# Patient Record
Sex: Female | Born: 1990 | Race: Black or African American | Hispanic: No | Smoking: Never smoker
Health system: Southern US, Community
[De-identification: ages and names within clinical notes are randomized; demographics above are authoritative.]

## PROBLEM LIST (undated history)

## (undated) DIAGNOSIS — F419 Anxiety disorder, unspecified: Secondary | ICD-10-CM

## (undated) DIAGNOSIS — D649 Anemia, unspecified: Secondary | ICD-10-CM

## (undated) DIAGNOSIS — Z789 Other specified health status: Secondary | ICD-10-CM

## (undated) DIAGNOSIS — N63 Unspecified lump in unspecified breast: Secondary | ICD-10-CM

## (undated) HISTORY — DX: Anemia, unspecified: D64.9

## (undated) HISTORY — PX: HERNIA REPAIR: SHX51

---
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
## 2011-03-16 LAB — STREP B DNA PROBE: GBS: NEGATIVE

## 2011-04-09 ENCOUNTER — Encounter (HOSPITAL_COMMUNITY): Payer: Self-pay | Admitting: *Deleted

## 2011-04-09 ENCOUNTER — Inpatient Hospital Stay (HOSPITAL_COMMUNITY)
Admission: AD | Admit: 2011-04-09 | Discharge: 2011-04-11 | DRG: 775 | Disposition: A | Discharge status: Home / Self Care | Payer: Medicaid Other | Source: Ambulatory Visit | Attending: Obstetrics & Gynecology | Admitting: Obstetrics & Gynecology

## 2011-04-09 DIAGNOSIS — O411090 Infection of amniotic sac and membranes, unspecified, unspecified trimester, not applicable or unspecified: Principal | ICD-10-CM | POA: Diagnosis present

## 2011-04-09 HISTORY — DX: Other specified health status: Z78.9

## 2011-04-09 LAB — CBC
HCT: 33.8 % — ABNORMAL LOW (ref 36.0–46.0)
Hemoglobin: 11 g/dL — ABNORMAL LOW (ref 12.0–15.0)
MCH: 27.6 pg (ref 26.0–34.0)
MCV: 84.9 fL (ref 78.0–100.0)
Platelets: 371 10*3/uL (ref 150–400)
RBC: 3.98 MIL/uL (ref 3.87–5.11)
WBC: 10.6 10*3/uL — ABNORMAL HIGH (ref 4.0–10.5)

## 2011-04-09 LAB — ABO/RH: ABO/RH(D): O POS

## 2011-04-09 MED ORDER — ONDANSETRON HCL 4 MG/2ML IJ SOLN: 4.0000 mg | Freq: Four times a day (QID) | INTRAMUSCULAR | Status: DC | PRN

## 2011-04-09 MED ORDER — IBUPROFEN 600 MG PO TABS
600.0000 mg | ORAL_TABLET | Freq: Four times a day (QID) | ORAL | Status: DC
  Administered 2011-04-10 – 2011-04-11 (×6): 600 mg via ORAL
  Filled 2011-04-09 (×5): qty 1

## 2011-04-09 MED ORDER — ACETAMINOPHEN 325 MG PO TABS: 650.0000 mg | ORAL_TABLET | ORAL | Status: DC | PRN

## 2011-04-09 MED ORDER — DIPHENHYDRAMINE HCL 25 MG PO CAPS: 25.0000 mg | ORAL_CAPSULE | Freq: Four times a day (QID) | ORAL | Status: DC | PRN

## 2011-04-09 MED ORDER — OXYTOCIN BOLUS FROM INFUSION
500.0000 mL | Freq: Once | INTRAVENOUS | Status: DC
  Filled 2011-04-09: qty 500

## 2011-04-09 MED ORDER — WITCH HAZEL-GLYCERIN EX PADS: 1.0000 "application " | MEDICATED_PAD | CUTANEOUS | Status: DC | PRN

## 2011-04-09 MED ORDER — BUTORPHANOL TARTRATE 2 MG/ML IJ SOLN
1.0000 mg | INTRAMUSCULAR | Status: DC
  Administered 2011-04-09: 1 mg via INTRAVENOUS

## 2011-04-09 MED ORDER — LACTATED RINGERS IV SOLN: 500.0000 mL | INTRAVENOUS | Status: DC | PRN

## 2011-04-09 MED ORDER — CEFAZOLIN SODIUM 1-5 GM-% IV SOLN
1.0000 g | Freq: Three times a day (TID) | INTRAVENOUS | Status: DC
  Administered 2011-04-09: 1 g via INTRAVENOUS
  Filled 2011-04-09 (×3): qty 50

## 2011-04-09 MED ORDER — BUTORPHANOL TARTRATE 2 MG/ML IJ SOLN
1.0000 mg | INTRAMUSCULAR | Status: DC | PRN
  Administered 2011-04-09 (×2): 1 mg via INTRAVENOUS
  Filled 2011-04-09 (×4): qty 1

## 2011-04-09 MED ORDER — LIDOCAINE HCL (PF) 1 % IJ SOLN
30.0000 mL | INTRAMUSCULAR | Status: DC | PRN
  Filled 2011-04-09: qty 30

## 2011-04-09 MED ORDER — PRENATAL PLUS 27-1 MG PO TABS
1.0000 | ORAL_TABLET | Freq: Every day | ORAL | Status: DC
  Administered 2011-04-10 – 2011-04-11 (×2): 1 via ORAL
  Filled 2011-04-09 (×2): qty 1

## 2011-04-09 MED ORDER — OXYTOCIN 20 UNITS IN LACTATED RINGERS INFUSION - SIMPLE: 125.0000 mL/h | Freq: Once | INTRAVENOUS | Status: DC

## 2011-04-09 MED ORDER — LACTATED RINGERS IV SOLN
INTRAVENOUS | Status: DC
  Administered 2011-04-09 (×2): via INTRAVENOUS

## 2011-04-09 MED ORDER — IBUPROFEN 600 MG PO TABS: 600.0000 mg | ORAL_TABLET | Freq: Four times a day (QID) | ORAL | Status: DC | PRN

## 2011-04-09 MED ORDER — TETANUS-DIPHTH-ACELL PERTUSSIS 5-2.5-18.5 LF-MCG/0.5 IM SUSP
0.5000 mL | Freq: Once | INTRAMUSCULAR | Status: AC
  Administered 2011-04-10: 0.5 mL via INTRAMUSCULAR
  Filled 2011-04-09: qty 0.5

## 2011-04-09 MED ORDER — TERBUTALINE SULFATE 1 MG/ML IJ SOLN: 0.2500 mg | Freq: Once | INTRAMUSCULAR | Status: AC | PRN

## 2011-04-09 MED ORDER — OXYCODONE-ACETAMINOPHEN 5-325 MG PO TABS: 1.0000 | ORAL_TABLET | ORAL | Status: DC | PRN

## 2011-04-09 MED ORDER — SIMETHICONE 80 MG PO CHEW: 80.0000 mg | CHEWABLE_TABLET | ORAL | Status: DC | PRN

## 2011-04-09 MED ORDER — OXYTOCIN 20 UNITS IN LACTATED RINGERS INFUSION - SIMPLE
1.0000 m[IU]/min | INTRAVENOUS | Status: DC
  Administered 2011-04-09: 2 m[IU]/min via INTRAVENOUS
  Filled 2011-04-09: qty 1000

## 2011-04-09 MED ORDER — LIDOCAINE HCL (PF) 1 % IJ SOLN
30.0000 mL | INTRAMUSCULAR | Status: DC | PRN
  Filled 2011-04-09 (×2): qty 30

## 2011-04-09 MED ORDER — SENNOSIDES-DOCUSATE SODIUM 8.6-50 MG PO TABS
2.0000 | ORAL_TABLET | Freq: Every day | ORAL | Status: DC
  Administered 2011-04-09 – 2011-04-10 (×2): 2 via ORAL

## 2011-04-09 MED ORDER — LACTATED RINGERS IV SOLN: INTRAVENOUS | Status: DC

## 2011-04-09 MED ORDER — IBUPROFEN 600 MG PO TABS
600.0000 mg | ORAL_TABLET | Freq: Four times a day (QID) | ORAL | Status: DC | PRN
  Filled 2011-04-09: qty 1

## 2011-04-09 MED ORDER — ZOLPIDEM TARTRATE 5 MG PO TABS: 5.0000 mg | ORAL_TABLET | Freq: Every evening | ORAL | Status: DC | PRN

## 2011-04-09 MED ORDER — ONDANSETRON HCL 4 MG/2ML IJ SOLN: 4.0000 mg | INTRAMUSCULAR | Status: DC | PRN

## 2011-04-09 MED ORDER — CITRIC ACID-SODIUM CITRATE 334-500 MG/5ML PO SOLN: 30.0000 mL | ORAL | Status: DC | PRN

## 2011-04-09 MED ORDER — BENZOCAINE-MENTHOL 20-0.5 % EX AERO: 1.0000 "application " | INHALATION_SPRAY | CUTANEOUS | Status: DC | PRN

## 2011-04-09 MED ORDER — OXYCODONE-ACETAMINOPHEN 5-325 MG PO TABS: 2.0000 | ORAL_TABLET | ORAL | Status: DC | PRN

## 2011-04-09 MED ORDER — FLEET ENEMA 7-19 GM/118ML RE ENEM: 1.0000 | ENEMA | RECTAL | Status: DC | PRN

## 2011-04-09 MED ORDER — DIBUCAINE 1 % RE OINT: 1.0000 "application " | TOPICAL_OINTMENT | RECTAL | Status: DC | PRN

## 2011-04-09 MED ORDER — ONDANSETRON HCL 4 MG PO TABS: 4.0000 mg | ORAL_TABLET | ORAL | Status: DC | PRN

## 2011-04-09 MED ORDER — CEFAZOLIN SODIUM-DEXTROSE 2-3 GM-% IV SOLR
2.0000 g | Freq: Once | INTRAVENOUS | Status: AC
  Administered 2011-04-09: 2 g via INTRAVENOUS
  Filled 2011-04-09: qty 50

## 2011-04-09 MED ORDER — LANOLIN HYDROUS EX OINT: TOPICAL_OINTMENT | CUTANEOUS | Status: DC | PRN

## 2011-04-09 NOTE — H&P (Signed)
  20 y.o. G1P0  Estimated Date of Delivery: 04/12/11 admitted at 39/[redacted] weeks gestation for SROM and early labor. Uncomplicated prenatal course.  Prenatal labs: Blood Type:O+.  Screening tests for HIV, Syphilis, Hepatitis B, Rubella sensitivity, gestational diabetes, and perineal group B strep colonization were all negative.  Patient declined early fetal screens.   Afebrile, VSS Heart and Lungs: No active disease Abdomen: soft, gravid, EFW AGA. Cervical exam:  1.5/80  Impression: Early labor, SROM.  Plan:  Admit for delivery.

## 2011-04-09 NOTE — Progress Notes (Signed)
Cervix 4.5/100.  Vtx -1, not well applied. Imp: Good progress but potential for long labor with ROM. Plan: IUPC placed.  Ancef 2gm now and 1 gm q8h while in labor for chorio prophylaxis.

## 2011-04-09 NOTE — Progress Notes (Signed)
Del of viable female infant. apgars 9/9.

## 2011-04-09 NOTE — Progress Notes (Signed)
G1P1 at 39.4wks Ctxs since 2400. Leaking a lot of fld since 0620

## 2011-04-09 NOTE — Progress Notes (Signed)
Notified of SROM and SVE. ORder toadmit to L&D

## 2011-04-09 NOTE — Progress Notes (Signed)
Patient transferred to room 131 for post partum care

## 2011-04-10 LAB — CBC
HCT: 28.7 % — ABNORMAL LOW (ref 36.0–46.0)
MCHC: 32.8 g/dL (ref 30.0–36.0)
MCV: 84.7 fL (ref 78.0–100.0)
RDW: 14 % (ref 11.5–15.5)

## 2011-04-10 NOTE — Progress Notes (Addendum)
Patient is eating, ambulating, voiding.  Pain control is good.  Filed Vitals:   04/09/11 2030 04/09/11 2130 04/10/11 0135 04/10/11 0558  BP: 116/76 117/70 110/56 124/81  Pulse: 75 79 87 64  Temp: 98.1 F (36.7 C) 98.4 F (36.9 C) 99.1 F (37.3 C) 98.4 F (36.9 C)  TempSrc: Oral Oral Oral Oral  Resp: 20 20 18 18   Height:      Weight:        Fundus firm Perineum without swelling.  Lab Results  Component Value Date   WBC 20.7* 04/10/2011   HGB 9.4* 04/10/2011   HCT 28.7* 04/10/2011   MCV 84.7 04/10/2011   PLT 319 04/10/2011    --/--/O POS (09/02 0834), Rubella Immune  A/P Post partum day 1.  Routine care.  Expect d/c per plan.   Circ in office. Adleigh Mcmasters A

## 2011-04-11 NOTE — Progress Notes (Signed)
Post Partum Day 2 Subjective: no complaints  Objective: Blood pressure 119/74, pulse 68, temperature 98.4 F (36.9 C), temperature source Oral, resp. rate 18, height 5\' 11"  (1.803 m), weight 80.74 kg (178 lb), unknown if currently breastfeeding.  Physical Exam:  General: alert Lochia: appropriate Uterine Fundus: firm   Basename 04/10/11 0531 04/09/11 0834  HGB 9.4* 11.0*  HCT 28.7* 33.8*    Assessment/Plan: Discharge home   LOS: 2 days   Lashauna Arpin D 04/11/2011, 10:25 AM

## 2011-04-11 NOTE — Discharge Summary (Signed)
Obstetric Discharge Summary Reason for Admission: onset of labor and rupture of membranes Prenatal Procedures: ultrasound Intrapartum Procedures: spontaneous vaginal delivery, midline episiotomy with repair (3-0 Chromic suture) Postpartum Procedures: none Complications-Operative and Postpartum: none Hemoglobin  Date Value Range Status  04/10/2011 9.4* 12.0-15.0 (g/dL) Final     HCT  Date Value Range Status  04/10/2011 28.7* 36.0-46.0 (%) Final    Discharge Diagnoses: Term Pregnancy-delivered  Discharge Information: Date: 04/11/2011 Activity: pelvic rest Diet: routine Medications: PNV and Ibuprophen Condition: stable Instructions: refer to practice specific booklet Discharge to: home Follow-up Information    Follow up with [facility]. Make an appointment in 1 week. (For infant circumcision)    Contact information:   [address] [place] [address] [phone]          Newborn Data: Live born female  Birth Weight: 7 lb 3 oz (3260 g) APGAR: 9, 9  Home with mother.  Jennifer Price D 04/11/2011, 10:22 AM

## 2011-04-11 NOTE — Progress Notes (Signed)
UR Chart review completed.  

## 2012-11-05 ENCOUNTER — Encounter (HOSPITAL_COMMUNITY): Payer: Self-pay

## 2012-11-05 ENCOUNTER — Inpatient Hospital Stay (HOSPITAL_COMMUNITY)
Admission: AD | Admit: 2012-11-05 | Discharge: 2012-11-05 | Disposition: A | Discharge status: Home / Self Care | Payer: BC Managed Care – PPO | Source: Ambulatory Visit | Attending: Obstetrics and Gynecology | Admitting: Obstetrics and Gynecology

## 2012-11-05 DIAGNOSIS — N949 Unspecified condition associated with female genital organs and menstrual cycle: Secondary | ICD-10-CM | POA: Insufficient documentation

## 2012-11-05 DIAGNOSIS — R102 Pelvic and perineal pain: Secondary | ICD-10-CM

## 2012-11-05 DIAGNOSIS — N390 Urinary tract infection, site not specified: Secondary | ICD-10-CM

## 2012-11-05 DIAGNOSIS — R109 Unspecified abdominal pain: Secondary | ICD-10-CM | POA: Insufficient documentation

## 2012-11-05 LAB — URINALYSIS, ROUTINE W REFLEX MICROSCOPIC
Bilirubin Urine: NEGATIVE
Protein, ur: NEGATIVE mg/dL
Urobilinogen, UA: 0.2 mg/dL (ref 0.0–1.0)

## 2012-11-05 MED ORDER — SULFAMETHOXAZOLE-TRIMETHOPRIM 800-160 MG PO TABS: 1.0000 | ORAL_TABLET | Freq: Two times a day (BID) | ORAL | Status: DC

## 2012-11-05 NOTE — MAU Note (Signed)
Patient states that the she had the IUD removed 6 months ago. She feels like a piece of it might be inbedded in her vagina. She states that while she was having intercourse with her boyfriend he felt something scratch his penis. She states that they applied neosporin on his penis for the scratch. She denies vaginal bleeding.

## 2012-11-05 NOTE — MAU Note (Signed)
No pain at present however when pain comes on, pt rates pain 10.

## 2012-11-05 NOTE — MAU Provider Note (Signed)
History     CSN: [id number]  Arrival date and time: 11/05/12 1740   First Provider Initiated Contact with Patient 11/05/12 2008      Chief Complaint  Patient presents with  . Abdominal Pain  . Urinary Tract Infection   HPI  Pt is not pregnant and complains of sharp vaginal pain and pain after she urinates.  Her urine is cloudy and has an odor.  She had a Mirena which was removed by Dr. Arlyce Dice last year.  She continues to have sharp pain in her vagina like she had when she had the Mirena in. She is on BCP. Her partner has had pain like something is poking him. She denies constipation but has had diarrhea for 3 days;  denies vomiting.  She denies fever or chills.  She has had the same partner for 4 years.  Pt was recently treated for UTI with MAcrobid- symptoms improved slightly but have recurred.   Past Medical History  Diagnosis Date  . No pertinent past medical history     Past Surgical History  Procedure Laterality Date  . Hernia repair      Family History  Problem Relation Age of Onset  . Hypertension Mother   . Hypertension Father   . Cancer Father   . Diabetes Maternal Aunt   . Cancer Maternal Grandfather     History  Substance Use Topics  . Smoking status: Never Smoker   . Smokeless tobacco: Not on file  . Alcohol Use: No    Allergies: No Known Allergies  Prescriptions prior to admission  Medication Sig Dispense Refill  . prenatal vitamin w/FE, FA (PRENATAL 1 + 1) 27-1 MG TABS Take 1 tablet by mouth daily.          Review of Systems  Constitutional: Negative for fever and chills.  Gastrointestinal: Positive for abdominal pain and diarrhea. Negative for nausea, vomiting and constipation.  Genitourinary: Positive for dysuria and frequency. Negative for urgency.  Neurological: Negative for headaches.   Physical Exam   Blood pressure 128/70, pulse 71, temperature 98.1 F (36.7 C), temperature source Oral, resp. rate 16, height 5' 8.5" (1.74 m), weight  69.4 kg (153 lb), last menstrual period 11/04/2012, not currently breastfeeding.  Physical Exam  Vitals reviewed. Constitutional: She is oriented to person, place, and time. She appears well-developed and well-nourished.  HENT:  Head: Normocephalic.  Eyes: Pupils are equal, round, and reactive to light.  Neck: Normal range of motion. Neck supple.  Cardiovascular: Normal rate.   Respiratory: Effort normal.  GI: Soft. She exhibits no distension. There is no tenderness. There is no rebound and no guarding.  No CVA tenderness  Genitourinary:  Close inspection of external areas and vaginal mucosa revealed pale pink moist mucosa without evidence of any fissures, erythema or tears, cervix clean, parous, nontender; uterus NSC NT; mild right adnexal tenderness without rebound; left adnexa without tenderness or palpable enlargement  Musculoskeletal: Normal range of motion.  Neurological: She is alert and oriented to person, place, and time.  Skin: Skin is warm and dry.  Psychiatric: She has a normal mood and affect.    MAU Course  Procedures Results for orders placed during the hospital encounter of 11/05/12 (from the past 24 hour(s))  URINALYSIS, ROUTINE W REFLEX MICROSCOPIC     Status: Abnormal   Collection Time    11/05/12  6:44 PM      Result Value Range   Color, Urine YELLOW  YELLOW   APPearance HAZY (*)  CLEAR   Specific Gravity, Urine >1.030 (*) 1.005 - 1.030   pH 6.5  5.0 - 8.0   Glucose, UA NEGATIVE  NEGATIVE mg/dL   Hgb urine dipstick TRACE (*) NEGATIVE   Bilirubin Urine NEGATIVE  NEGATIVE   Ketones, ur NEGATIVE  NEGATIVE mg/dL   Protein, ur NEGATIVE  NEGATIVE mg/dL   Urobilinogen, UA 0.2  0.0 - 1.0 mg/dL   Nitrite POSITIVE (*) NEGATIVE   Leukocytes, UA NEGATIVE  NEGATIVE  URINE MICROSCOPIC-ADD ON     Status: Abnormal   Collection Time    11/05/12  6:44 PM      Result Value Range   Squamous Epithelial / LPF RARE  RARE   WBC, UA 21-50  <3 WBC/hpf   Bacteria, UA MANY (*)  RARE   Urine-Other MUCOUS PRESENT    WET PREP, GENITAL     Status: Abnormal   Collection Time    11/05/12  8:21 PM      Result Value Range   Yeast Wet Prep HPF POC NONE SEEN  NONE SEEN   Trich, Wet Prep NONE SEEN  NONE SEEN   Clue Cells Wet Prep HPF POC NONE SEEN  NONE SEEN   WBC, Wet Prep HPF POC FEW (*) NONE SEEN   urine culture pending Assessment and Plan  Vaginal pain- undetermined etiology UTI Cipro 500mg  1 PO for 3 days Urine culture pending F/u with Dr. Althea Charon 11/05/2012, 8:08 PM

## 2012-11-05 NOTE — MAU Note (Signed)
Pt states had IUD removed at least 6 months ago due to improper location, has had issues with abd/uterine pain since then. States has been prescribed macrobid multiple times for UTI. Urine cloudy with strong odor noted as well. Bladder pains intermittently. Vaginal d/c is sticky in consistency, can be yellowish to clear.

## 2012-11-06 LAB — GC/CHLAMYDIA PROBE AMP
CT Probe RNA: NEGATIVE
GC Probe RNA: NEGATIVE

## 2012-11-08 LAB — URINE CULTURE: Colony Count: >=100000

## 2013-12-30 ENCOUNTER — Other Ambulatory Visit: Payer: Self-pay | Admitting: Obstetrics & Gynecology

## 2013-12-30 DIAGNOSIS — N6320 Unspecified lump in the left breast, unspecified quadrant: Secondary | ICD-10-CM

## 2014-01-05 ENCOUNTER — Encounter (INDEPENDENT_AMBULATORY_CARE_PROVIDER_SITE_OTHER): Payer: Self-pay

## 2014-01-05 ENCOUNTER — Ambulatory Visit
Admission: RE | Admit: 2014-01-05 | Discharge: 2014-01-05 | Disposition: A | Discharge status: Home / Self Care | Payer: BC Managed Care – PPO | Source: Ambulatory Visit | Attending: Obstetrics & Gynecology | Admitting: Obstetrics & Gynecology

## 2014-01-05 DIAGNOSIS — N6320 Unspecified lump in the left breast, unspecified quadrant: Secondary | ICD-10-CM

## 2014-06-08 ENCOUNTER — Encounter (HOSPITAL_COMMUNITY): Payer: Self-pay

## 2014-12-24 IMAGING — US US BREAST*L* LIMITED INC AXILLA
1 series · 5 of 5 positions shown · non-contrast
Comparison: None.

CLINICAL DATA: 22-year-old female with a left breast palpable
abnormality.

EXAM:
ULTRASOUND OF THE LEFT BREAST

[Series 1: breast · 5 of 5 slices shown]
[im 1/5]
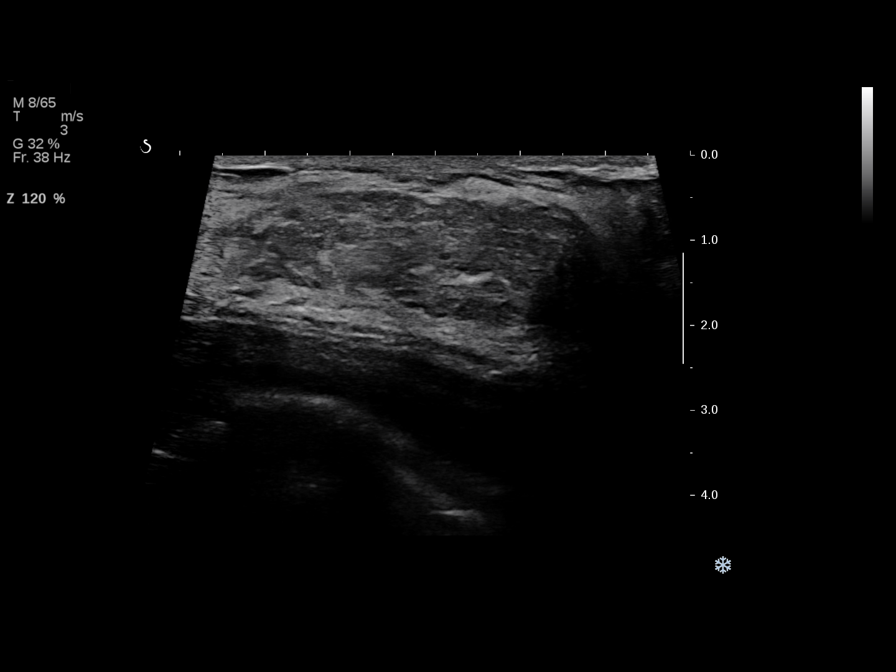
[im 2/5]
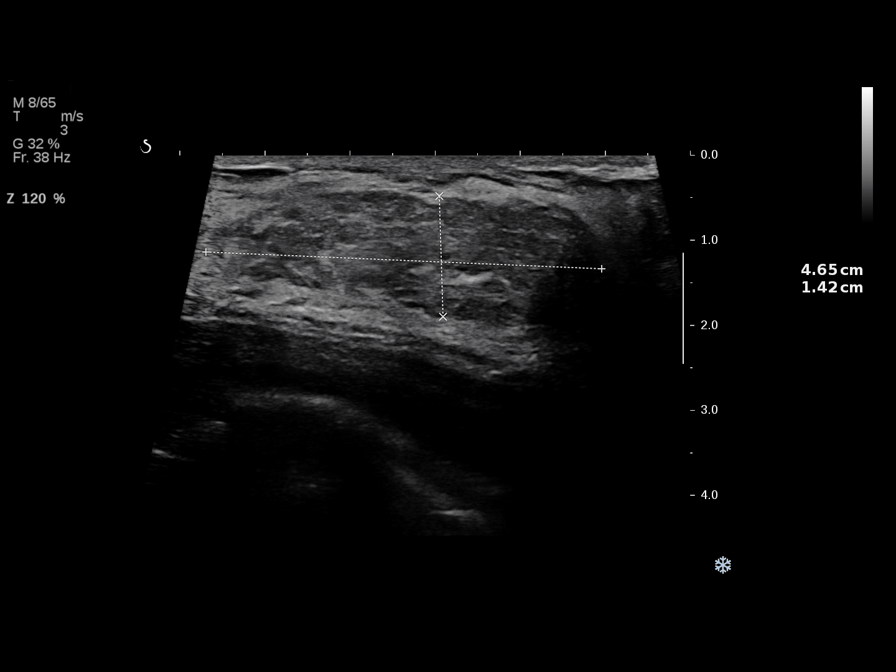
[im 3/5]
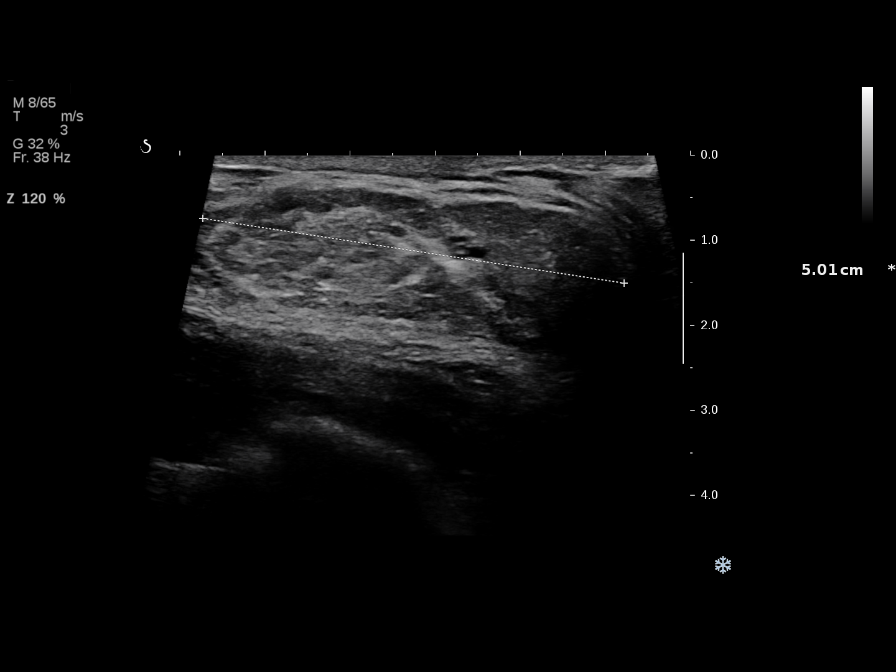
[im 4/5]
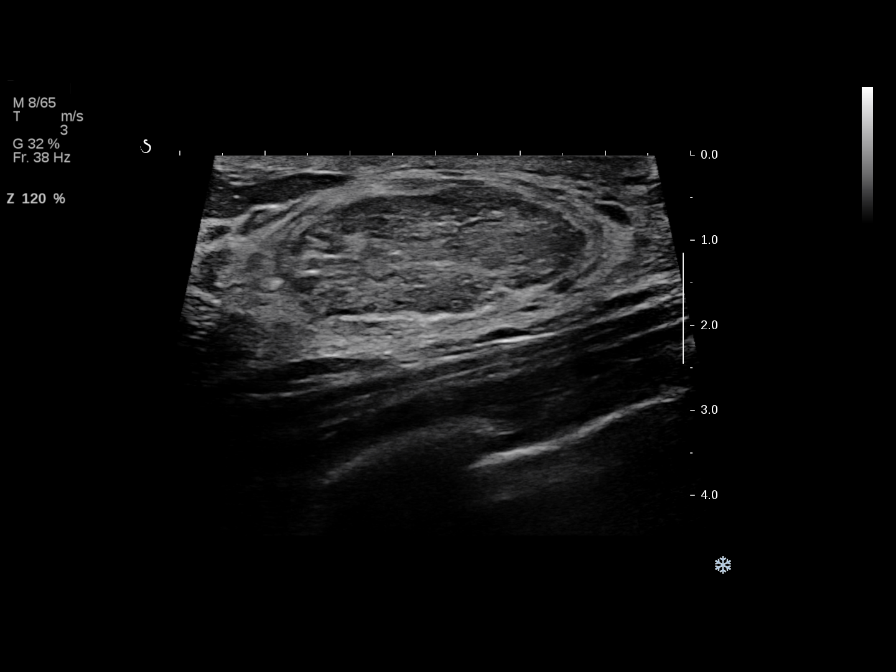
[im 5/5]
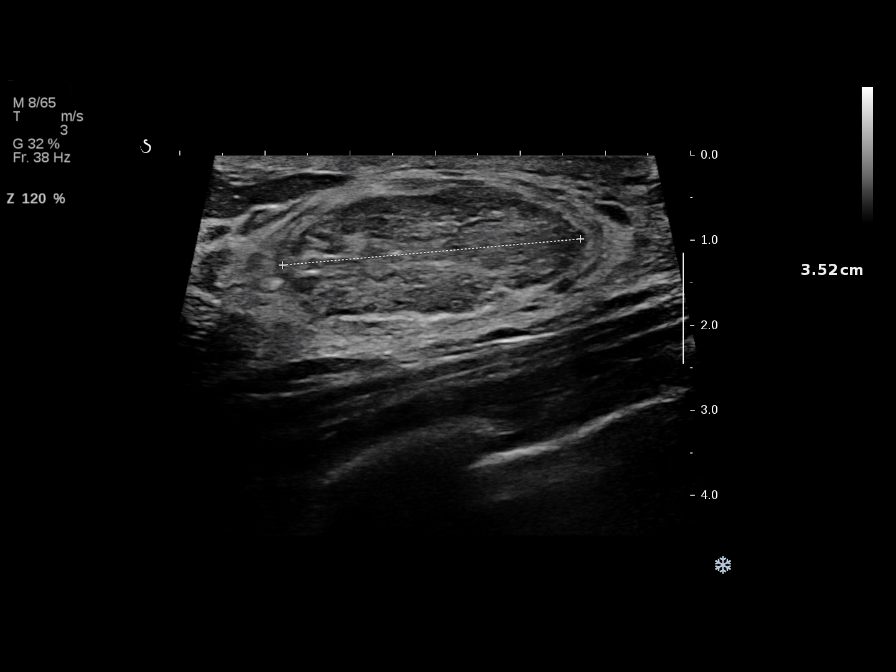

[5 of 5 positions shown; findings below may reference images not displayed]

FINDINGS: On physical exam,I palpate a discrete, mobile mass in the subareolar
region of the left breast..

Ultrasound is performed, showing a well circumscribed predominantly
hypoechoic mass with some internal fat measuring 5.0 x 1.4 x 3.5 cm.
It is felt to likely be a benign fibroadenoma.
IMPRESSION: Probable fibroadenoma in the left breast.

RECOMMENDATION:
Short-term interval followup ultrasound in 6 months,
ultrasound-guided core biopsy and surgical excision would discussed
with the patient. The patient elects to have a short-term interval
followup ultrasound in 6 months. The importance of self-breast
examination was discussed with the patient.

I have discussed the findings and recommendations with the patient.
Results were also provided in writing at the conclusion of the
visit. If applicable, a reminder letter will be sent to the patient
regarding the next appointment.

BI-RADS CATEGORY  3: Probably benign finding(s) - short interval
follow-up suggested.

## 2015-05-15 ENCOUNTER — Encounter (HOSPITAL_COMMUNITY): Payer: Self-pay | Admitting: Emergency Medicine

## 2015-05-15 ENCOUNTER — Emergency Department (HOSPITAL_COMMUNITY)
Admission: EM | Admit: 2015-05-15 | Discharge: 2015-05-15 | Disposition: A | Discharge status: Home / Self Care | Payer: BLUE CROSS/BLUE SHIELD | Attending: Emergency Medicine | Admitting: Emergency Medicine

## 2015-05-15 DIAGNOSIS — S6991XA Unspecified injury of right wrist, hand and finger(s), initial encounter: Secondary | ICD-10-CM | POA: Diagnosis present

## 2015-05-15 DIAGNOSIS — W25XXXA Contact with sharp glass, initial encounter: Secondary | ICD-10-CM | POA: Insufficient documentation

## 2015-05-15 DIAGNOSIS — Z792 Long term (current) use of antibiotics: Secondary | ICD-10-CM | POA: Insufficient documentation

## 2015-05-15 DIAGNOSIS — S61219A Laceration without foreign body of unspecified finger without damage to nail, initial encounter: Secondary | ICD-10-CM

## 2015-05-15 DIAGNOSIS — S61011A Laceration without foreign body of right thumb without damage to nail, initial encounter: Secondary | ICD-10-CM | POA: Diagnosis not present

## 2015-05-15 DIAGNOSIS — Z793 Long term (current) use of hormonal contraceptives: Secondary | ICD-10-CM | POA: Diagnosis not present

## 2015-05-15 DIAGNOSIS — Y9389 Activity, other specified: Secondary | ICD-10-CM | POA: Diagnosis not present

## 2015-05-15 DIAGNOSIS — Y998 Other external cause status: Secondary | ICD-10-CM | POA: Diagnosis not present

## 2015-05-15 DIAGNOSIS — Y9289 Other specified places as the place of occurrence of the external cause: Secondary | ICD-10-CM | POA: Diagnosis not present

## 2015-05-15 MED ORDER — BACITRACIN ZINC 500 UNIT/GM EX OINT
TOPICAL_OINTMENT | Freq: Once | CUTANEOUS | Status: AC
  Administered 2015-05-15: 1 via TOPICAL

## 2015-05-15 MED ORDER — LIDOCAINE HCL (PF) 1 % IJ SOLN
5.0000 mL | Freq: Once | INTRAMUSCULAR | Status: AC
  Administered 2015-05-15: 5 mL via INTRADERMAL

## 2015-05-15 MED ORDER — LIDOCAINE HCL 1 % IJ SOLN
INTRAMUSCULAR | Status: AC
  Filled 2015-05-15: qty 20

## 2015-05-15 NOTE — ED Notes (Signed)
Pt reports lacerating her right hand (base of the inner thumb) with a drinking glass. Last tetanus was within the past 10 years. Moderate sanguineous drainage noted. Dry gauze applied. No other c/c.

## 2015-05-15 NOTE — ED Provider Notes (Signed)
CSN: [id number]     Arrival date & time 05/15/15  1826 History  By signing my name below, I, Jennifer Price, attest that this documentation has been prepared under the direction and in the presence of [employer], PA-C. Electronically Signed: [name], ED Scribe. 05/15/2015. 6:46 PM.    Chief Complaint  Patient presents with  . Extremity Laceration   The history is provided by the patient. No language interpreter was used.   HPI Comments: Jennifer Price is a 24 y.o. female who presents to the Emergency Department status post right finger injury that occurred about 30 minutes ago when she was picking up broken drinking glass. She reports a bleeding lac to the base of inner right thumb. She reports that she has had stitches in the past and her Tetanus vaccine is UTD. Pt is right hand dominate.   Past Medical History  Diagnosis Date  . No pertinent past medical history    Past Surgical History  Procedure Laterality Date  . Hernia repair     Family History  Problem Relation Age of Onset  . Hypertension Mother   . Hypertension Father   . Cancer Father   . Diabetes Maternal Aunt   . Cancer Maternal Grandfather    Social History  Substance Use Topics  . Smoking status: Never Smoker   . Smokeless tobacco: None  . Alcohol Use: No   OB History    Gravida Para Term Preterm AB TAB SAB Ectopic Multiple Living   Review of Systems A complete 10 system review of systems was obtained and all systems are negative except as noted in the HPI and PMH.     Allergies  Review of patient's allergies indicates no known allergies.  Home Medications   Prior to Admission medications   Medication Sig Start Date End Date Taking? Authorizing Provider  levonorgestrel-ethinyl estradiol (ORSYTHIA) 0.1-20 MG-MCG tablet Take 1 tablet by mouth daily.    Historical Provider, MD  sulfamethoxazole-trimethoprim (SEPTRA DS) 800-160 MG per tablet Take 1 tablet by mouth 2  (two) times daily. 11/05/12   [name], NP   BP 127/77 mmHg  Pulse 81  Temp(Src) 98.3 F (36.8 C) (Oral)  Resp 16  SpO2 99%  LMP 04/16/2015 (Approximate) Physical Exam  Constitutional: She is oriented to person, place, and time. She appears well-developed and well-nourished.  HENT:  Head: Normocephalic and atraumatic.  Cardiovascular: Normal rate.   Pulmonary/Chest: Effort normal.  Abdominal: She exhibits no distension.  Musculoskeletal:       Hands: Neurological: She is alert and oriented to person, place, and time.  Skin: Skin is warm and dry.  1.25 cm full thickness lac to base of right thumb over the interphalangeal joint.  Full active ROM to interphalangeal MCP joint Distally and neurovascularly intact.   Psychiatric: She has a normal mood and affect.  Nursing note and vitals reviewed.   ED Course  .Marland KitchenLaceration Repair Date/Time: 05/15/2015 7:17 PM Performed by: Wynetta Emery Authorized by: Wynetta Emery Consent: Verbal consent obtained. Risks and benefits: risks, benefits and alternatives were discussed Consent given by: patient Required items: required blood products, implants, devices, and special equipment available Patient identity confirmed: verbally with patient Body area: upper extremity Location details: right thumb Laceration length: 1.3 cm Foreign bodies: no foreign bodies Tendon involvement: none Nerve involvement: none Vascular damage: no Anesthesia: local infiltration Local anesthetic: lidocaine 1% without epinephrine Anesthetic total:  3 ml Preparation: Patient was prepped and draped in the usual sterile fashion. Irrigation solution: saline Irrigation method: syringe Amount of cleaning: extensive Debridement: none Degree of undermining: none Skin closure: Ethilon (4-0) Number of sutures: 5 Technique: simple Approximation: close Approximation difficulty: complex Dressing: 4x4 sterile gauze and antibiotic ointment Patient  tolerance: Patient tolerated the procedure well with no immediate complications   SPLINT APPLICATION Date/Time: 7:33 PM Authorized by: Wynetta Emery Consent: Verbal consent obtained. Risks and benefits: risks, benefits and alternatives were discussed Consent given by: patient Splint applied by: orthopedic technician Location details: right thumb Splint type: padded finger splint on volar aspect to immobilize 1st MCP Post-procedure: The splinted body part was neurovascularly unchanged following the procedure. Patient tolerance: Patient tolerated the procedure well with no immediate complications.    DIAGNOSTIC STUDIES: Oxygen Saturation is 99% on RA, normal by my interpretation.    COORDINATION OF CARE: 6:45 PM- Pt advised of plan for treatment and pt agrees.    Labs Review Labs Reviewed - No data to display  Imaging Review No results found. Wynetta Emery, PA-C has personally reviewed and evaluated these images and lab results as part of his medical decision-making.   EKG Interpretation None      MDM   Final diagnoses:  Laceration of finger of right hand, initial encounter    Filed Vitals:   05/15/15 1831  BP: 127/77  Pulse: 81  Temp: 98.3 F (36.8 C)  TempSrc: Oral  Resp: 16  SpO2: 99%    Medications  lidocaine (PF) (XYLOCAINE) 1 % injection 5 mL (5 mLs Intradermal Given by Other 05/15/15 1857)  lidocaine (XYLOCAINE) 1 % (with pres) injection (  Duplicate 05/15/15 1857)    Jennifer Price is a pleasant 24 y.o. female presenting with finger laceration.  No signs of tendon/joint involvement. Tetanus up to date Pressure irrigation performed. Laceration occurred < 8 hours prior to repair which was well tolerated. Pt has no co morbidities to effect normal wound healing. Discussed suture home care w pt and answered questions. Pt to f-u for wound check and suture removal in 7 days. Pt is hemodynamically stable with no complaints prior to dc.    Evaluation  does not show pathology that would require ongoing emergent intervention or inpatient treatment. Pt is hemodynamically stable and mentating appropriately. Discussed findings and plan with patient/guardian, who agrees with care plan. All questions answered. Return precautions discussed and outpatient follow up given.  I personally performed the services described in this documentation, which was scribed in my presence.  The recorded information has been reviewed and is accurate.   Wynetta Emery, PA-C 05/15/15 1945  Alvira Monday, MD 05/18/15 [phone]

## 2015-05-15 NOTE — Discharge Instructions (Signed)
Use the finger splint for the first 2-48 hours.   Keep wound dry and do not remove dressing for 24 hours if possible. After that, wash gently morning and night (every 12 hours) with soap and water. Use a topical antibiotic ointment and cover with a bandaid or gauze.    Do NOT use rubbing alcohol or hydrogen peroxide, do not soak the area   Present to your primary care doctor or the urgent care of your choice, or the ED for suture removal in 8-10 days.   Every attempt was made to remove foreign body (contaminants) from the wound.  However, there is always a chance that some may remain in the wound. This can  increase your risk of infection.   If you see signs of infection (warmth, redness, tenderness, pus, sharp increase in pain, fever, red streaking in the skin) immediately return to the emergency department.   After the wound heals fully, apply sunscreen for 6-12 months to minimize scarring.    Laceration Care, Adult A laceration is a cut that goes through all of the layers of the skin and into the tissue that is right under the skin. Some lacerations heal on their own. Others need to be closed with stitches (sutures), staples, skin adhesive strips, or skin glue. Proper laceration care minimizes the risk of infection and helps the laceration to heal better. HOW TO CARE FOR YOUR LACERATION If sutures or staples were used:  Keep the wound clean and dry.  If you were given a bandage (dressing), you should change it at least one time per day or as told by your health care provider. You should also change it if it becomes wet or dirty.  Keep the wound completely dry for the first 24 hours or as told by your health care provider. After that time, you may shower or bathe. However, make sure that the wound is not soaked in water until after the sutures or staples have been removed.  Clean the wound one time each day or as told by your health care provider:  Wash the wound with soap and  water.  Rinse the wound with water to remove all soap.  Pat the wound dry with a clean towel. Do not rub the wound.  After cleaning the wound, apply a thin layer of antibiotic ointmentas told by your health care provider. This will help to prevent infection and keep the dressing from sticking to the wound.  Have the sutures or staples removed as told by your health care provider. If skin adhesive strips were used:  Keep the wound clean and dry.  If you were given a bandage (dressing), you should change it at least one time per day or as told by your health care provider. You should also change it if it becomes dirty or wet.  Do not get the skin adhesive strips wet. You may shower or bathe, but be careful to keep the wound dry.  If the wound gets wet, pat it dry with a clean towel. Do not rub the wound.  Skin adhesive strips fall off on their own. You may trim the strips as the wound heals. Do not remove skin adhesive strips that are still stuck to the wound. They will fall off in time. If skin glue was used:  Try to keep the wound dry, but you may briefly wet it in the shower or bath. Do not soak the wound in water, such as by swimming.  After you have  showered or bathed, gently pat the wound dry with a clean towel. Do not rub the wound.  Do not do any activities that will make you sweat heavily until the skin glue has fallen off on its own.  Do not apply liquid, cream, or ointment medicine to the wound while the skin glue is in place. Using those may loosen the film before the wound has healed.  If you were given a bandage (dressing), you should change it at least one time per day or as told by your health care provider. You should also change it if it becomes dirty or wet.  If a dressing is placed over the wound, be careful not to apply tape directly over the skin glue. Doing that may cause the glue to be pulled off before the wound has healed.  Do not pick at the glue. The skin  glue usually remains in place for 5-10 days, then it falls off of the skin. General Instructions  Take over-the-counter and prescription medicines only as told by your health care provider.  If you were prescribed an antibiotic medicine or ointment, take or apply it as told by your doctor. Do not stop using it even if your condition improves.  To help prevent scarring, make sure to cover your wound with sunscreen whenever you are outside after stitches are removed, after adhesive strips are removed, or when glue remains in place and the wound is healed. Make sure to wear a sunscreen of at least 30 SPF.  Do not scratch or pick at the wound.  Keep all follow-up visits as told by your health care provider. This is important.  Check your wound every day for signs of infection. Watch for:  Redness, swelling, or pain.  Fluid, blood, or pus.  Raise (elevate) the injured area above the level of your heart while you are sitting or lying down, if possible. SEEK MEDICAL CARE IF:  You received a tetanus shot and you have swelling, severe pain, redness, or bleeding at the injection site.  You have a fever.  A wound that was closed breaks open.  You notice a bad smell coming from your wound or your dressing.  You notice something coming out of the wound, such as wood or glass.  Your pain is not controlled with medicine.  You have increased redness, swelling, or pain at the site of your wound.  You have fluid, blood, or pus coming from your wound.  You notice a change in the color of your skin near your wound.  You need to change the dressing frequently due to fluid, blood, or pus draining from the wound.  You develop a new rash.  You develop numbness around the wound. SEEK IMMEDIATE MEDICAL CARE IF:  You develop severe swelling around the wound.  Your pain suddenly increases and is severe.  You develop painful lumps near the wound or on skin that is anywhere on your body.  You  have a red streak going away from your wound.  The wound is on your hand or foot and you cannot properly move a finger or toe.  The wound is on your hand or foot and you notice that your fingers or toes look pale or bluish.   This information is not intended to replace advice given to you by your health care provider. Make sure you discuss any questions you have with your health care provider.   Document Released: 07/24/2005 Document Revised: 12/08/2014 Document Reviewed: 07/20/2014 Elsevier Interactive  Patient Education 2016 [employer].

## 2015-05-17 NOTE — ED Notes (Signed)
Pt's chart accessed by this Charge RN to print a work note, per Pt request.

## 2016-06-30 MED FILL — OXYCODONE/APAP 5/325 MG TAB: 5-325 | 5 days supply | Qty: 30 | Fill #0

## 2016-08-17 DIAGNOSIS — M21612 Bunion of left foot: Secondary | ICD-10-CM | POA: Diagnosis not present

## 2016-09-06 DIAGNOSIS — M21612 Bunion of left foot: Secondary | ICD-10-CM | POA: Diagnosis not present

## 2016-12-26 DIAGNOSIS — R102 Pelvic and perineal pain: Secondary | ICD-10-CM | POA: Diagnosis not present

## 2017-04-18 ENCOUNTER — Other Ambulatory Visit: Payer: Self-pay | Admitting: Obstetrics & Gynecology

## 2017-04-18 DIAGNOSIS — N63 Unspecified lump in unspecified breast: Secondary | ICD-10-CM

## 2017-05-02 ENCOUNTER — Other Ambulatory Visit: Payer: BLUE CROSS/BLUE SHIELD

## 2017-05-03 ENCOUNTER — Ambulatory Visit
Admission: RE | Admit: 2017-05-03 | Discharge: 2017-05-03 | Disposition: A | Discharge status: Home / Self Care | Payer: BLUE CROSS/BLUE SHIELD | Source: Ambulatory Visit | Attending: Obstetrics & Gynecology | Admitting: Obstetrics & Gynecology

## 2017-05-03 DIAGNOSIS — N63 Unspecified lump in unspecified breast: Secondary | ICD-10-CM

## 2017-05-03 DIAGNOSIS — N6489 Other specified disorders of breast: Secondary | ICD-10-CM | POA: Diagnosis not present

## 2017-06-14 DIAGNOSIS — J069 Acute upper respiratory infection, unspecified: Secondary | ICD-10-CM | POA: Diagnosis not present

## 2018-04-21 IMAGING — US ULTRASOUND LEFT BREAST LIMITED
1 series · 9 of 9 positions shown · non-contrast
Comparison: Left breast ultrasound dated 01/05/2014.

CLINICAL DATA: Increase in size of a previously evaluated left
breast retroareolar mass.

EXAM:
ULTRASOUND OF THE LEFT BREAST

[Series 1: ultrasound left breast limited · 0.06mm/px · 9 of 9 slices shown]
[im 1/9]
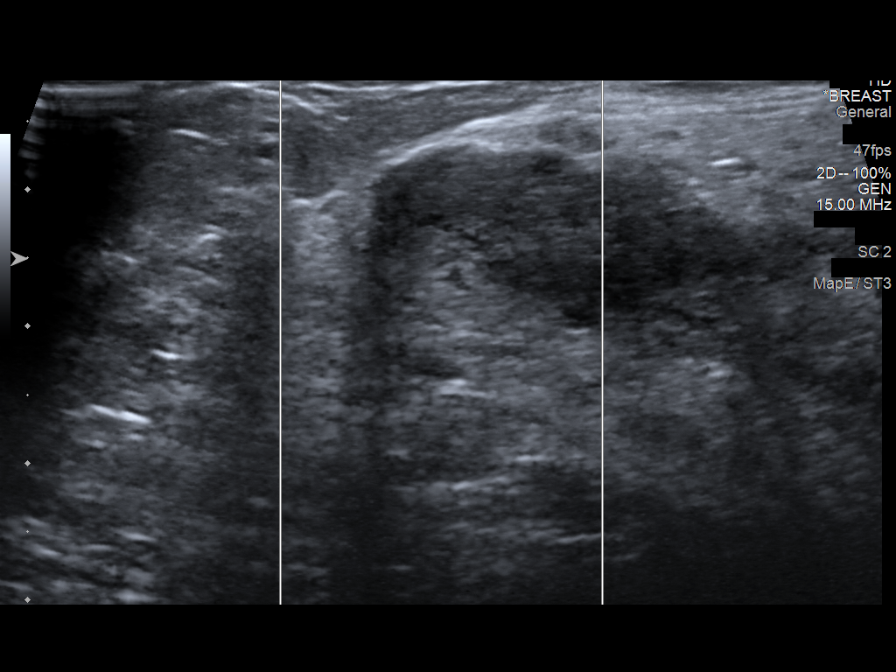
[im 2/9]
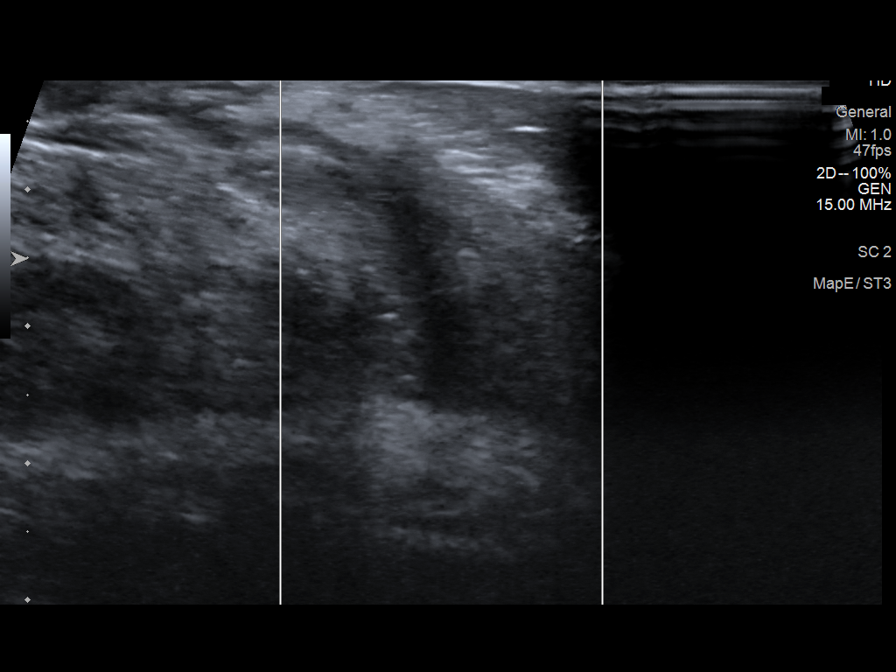
[im 3/9]
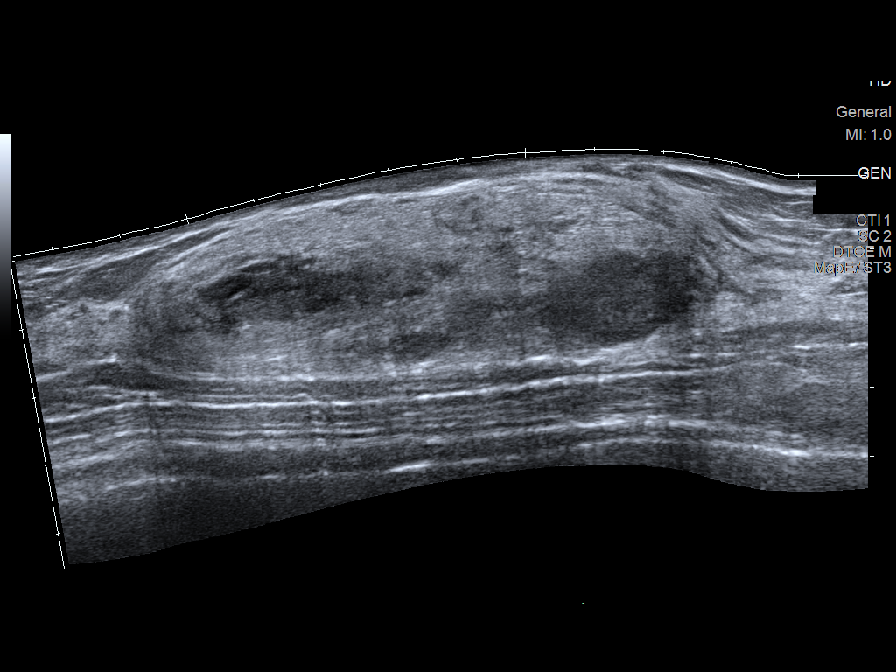
[im 4/9]
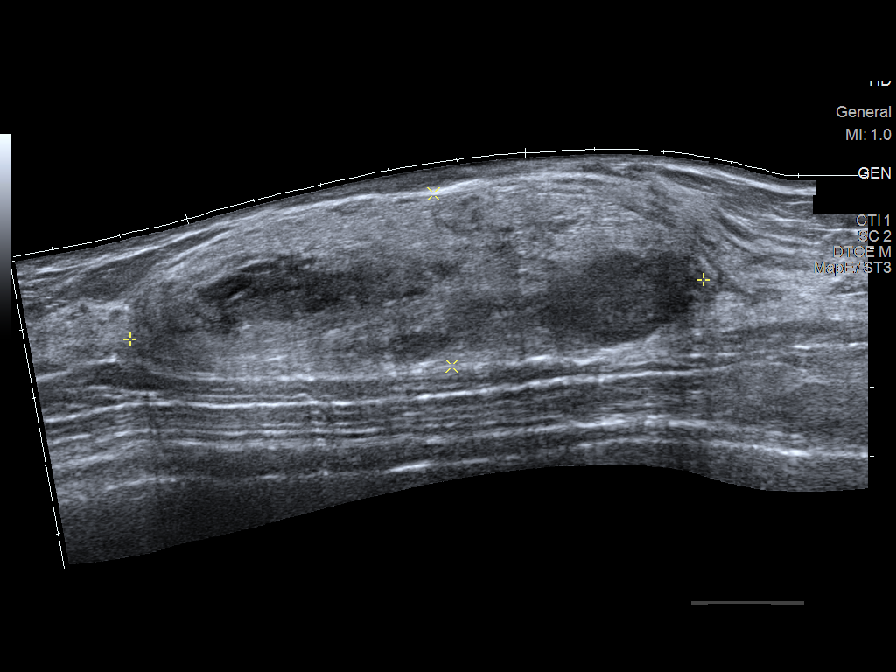
[im 5/9]
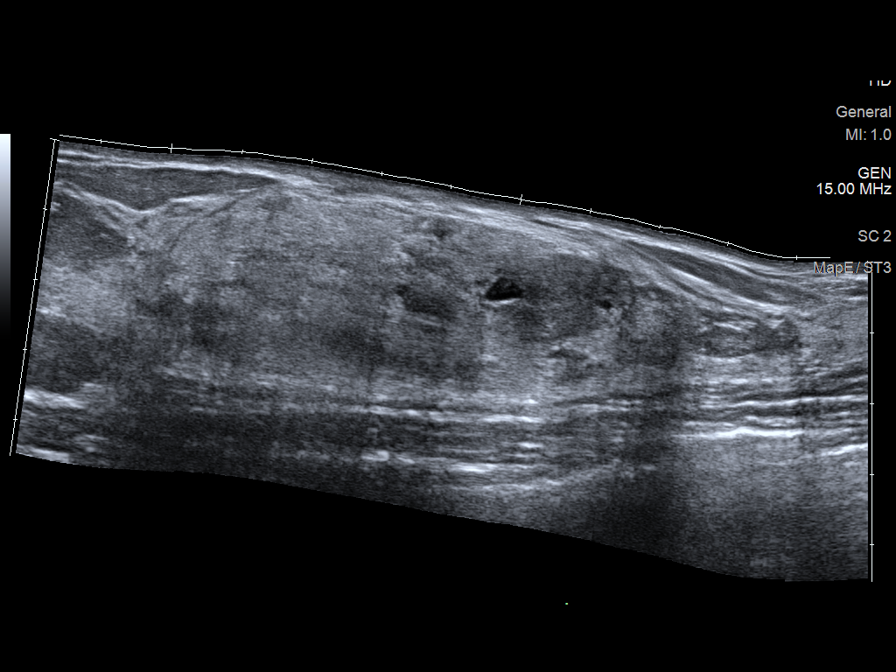
[im 6/9]
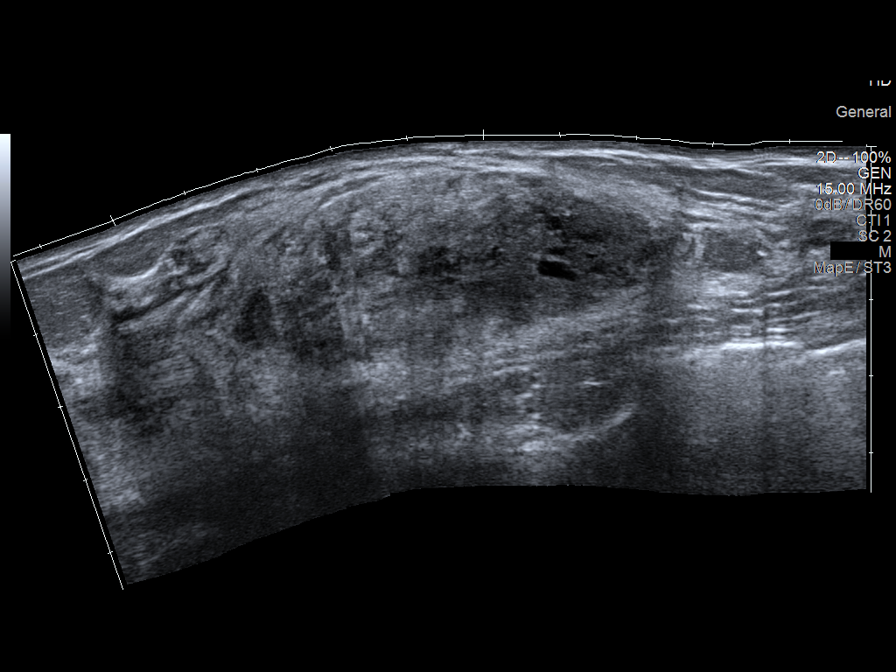
[im 7/9]
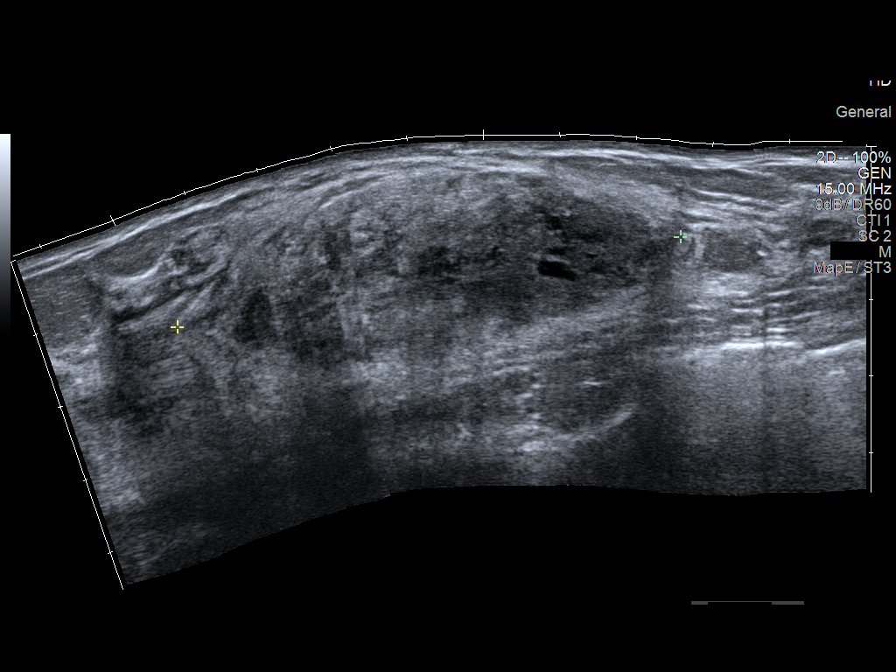
[im 8/9]
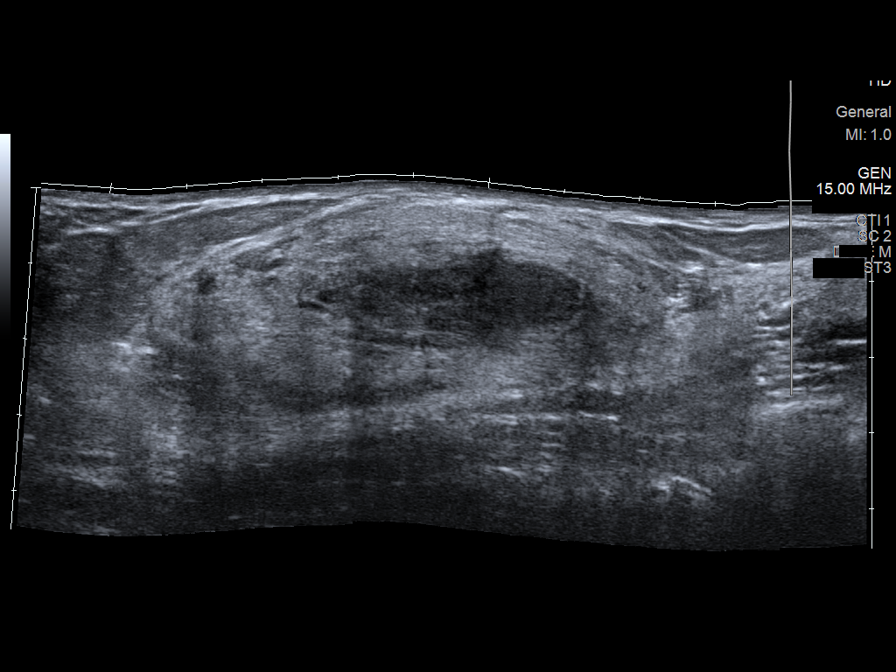
[im 9/9]
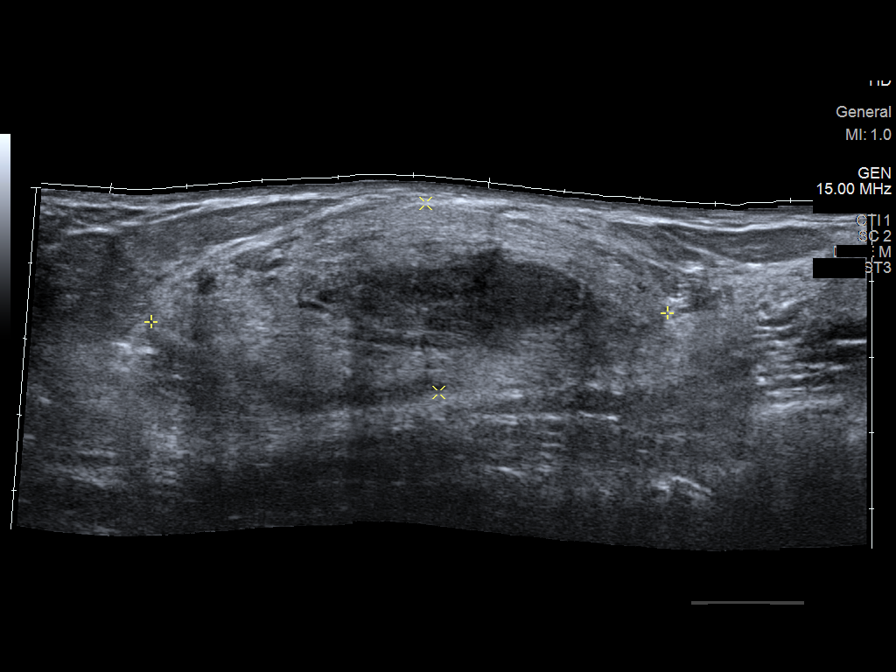

[9 of 9 positions shown; findings below may reference images not displayed]

FINDINGS: On physical exam, the patient has an approximately 7 x 5 cm oval,
mobile, somewhat firm and somewhat soft palpable mass in the
retroareolar left breast.

Targeted ultrasound is performed, showing an 8.3 x 6.7 x 2.5 cm
oval, horizontally oriented, in capsulated mass containing echogenic
and hypoechoic components. This is increased in size from 5.0 x
x 1.4 cm on 01/05/2014.
IMPRESSION: Increase in size of a left breast retroareolar fibroadenolipoma
(hamartoma), currently measuring 8.3 x 6.7 x 2.5 cm. No evidence of
malignancy.

RECOMMENDATION:
Annual screening mammography beginning at age 40. The option of
surgical excision of the left breast fibroadenolipoma if the patient
desires for cosmetic purposes or symptoms caused by the mass was
also discussed with the patient.

I have discussed the findings and recommendations with the patient.
Results were also provided in writing at the conclusion of the
visit. If applicable, a reminder letter will be sent to the patient
regarding the next appointment.

BI-RADS CATEGORY  2: Benign.

## 2018-07-09 DIAGNOSIS — F321 Major depressive disorder, single episode, moderate: Secondary | ICD-10-CM | POA: Diagnosis not present

## 2018-07-09 DIAGNOSIS — F411 Generalized anxiety disorder: Secondary | ICD-10-CM | POA: Diagnosis not present

## 2018-07-09 DIAGNOSIS — F329 Major depressive disorder, single episode, unspecified: Secondary | ICD-10-CM | POA: Diagnosis not present

## 2018-07-25 DIAGNOSIS — F418 Other specified anxiety disorders: Secondary | ICD-10-CM | POA: Diagnosis not present

## 2018-07-30 DIAGNOSIS — F419 Anxiety disorder, unspecified: Secondary | ICD-10-CM | POA: Diagnosis not present

## 2018-07-30 DIAGNOSIS — Z01411 Encounter for gynecological examination (general) (routine) with abnormal findings: Secondary | ICD-10-CM | POA: Diagnosis not present

## 2018-07-30 DIAGNOSIS — Z124 Encounter for screening for malignant neoplasm of cervix: Secondary | ICD-10-CM | POA: Diagnosis not present

## 2018-07-30 DIAGNOSIS — N6320 Unspecified lump in the left breast, unspecified quadrant: Secondary | ICD-10-CM | POA: Diagnosis not present

## 2018-07-30 DIAGNOSIS — Z113 Encounter for screening for infections with a predominantly sexual mode of transmission: Secondary | ICD-10-CM | POA: Diagnosis not present

## 2019-04-17 DIAGNOSIS — Z3046 Encounter for surveillance of implantable subdermal contraceptive: Secondary | ICD-10-CM | POA: Diagnosis not present

## 2019-04-17 DIAGNOSIS — Z309 Encounter for contraceptive management, unspecified: Secondary | ICD-10-CM | POA: Diagnosis not present

## 2019-09-30 DIAGNOSIS — N760 Acute vaginitis: Secondary | ICD-10-CM | POA: Diagnosis not present

## 2019-09-30 DIAGNOSIS — Z6825 Body mass index (BMI) 25.0-25.9, adult: Secondary | ICD-10-CM | POA: Diagnosis not present

## 2019-09-30 DIAGNOSIS — Z01411 Encounter for gynecological examination (general) (routine) with abnormal findings: Secondary | ICD-10-CM | POA: Diagnosis not present

## 2019-09-30 DIAGNOSIS — N898 Other specified noninflammatory disorders of vagina: Secondary | ICD-10-CM | POA: Diagnosis not present

## 2019-09-30 DIAGNOSIS — F411 Generalized anxiety disorder: Secondary | ICD-10-CM | POA: Diagnosis not present

## 2019-09-30 DIAGNOSIS — L659 Nonscarring hair loss, unspecified: Secondary | ICD-10-CM | POA: Diagnosis not present

## 2020-04-16 DIAGNOSIS — Z30011 Encounter for initial prescription of contraceptive pills: Secondary | ICD-10-CM | POA: Diagnosis not present

## 2020-04-16 DIAGNOSIS — N649 Disorder of breast, unspecified: Secondary | ICD-10-CM | POA: Diagnosis not present

## 2020-04-16 DIAGNOSIS — Z3046 Encounter for surveillance of implantable subdermal contraceptive: Secondary | ICD-10-CM | POA: Diagnosis not present

## 2020-04-16 DIAGNOSIS — F411 Generalized anxiety disorder: Secondary | ICD-10-CM | POA: Diagnosis not present

## 2020-05-18 ENCOUNTER — Other Ambulatory Visit: Payer: Self-pay | Admitting: Obstetrics & Gynecology

## 2020-05-18 DIAGNOSIS — N649 Disorder of breast, unspecified: Secondary | ICD-10-CM

## 2020-06-10 ENCOUNTER — Ambulatory Visit
Admission: RE | Admit: 2020-06-10 | Discharge: 2020-06-10 | Disposition: A | Discharge status: Home / Self Care | Payer: BC Managed Care – PPO | Source: Ambulatory Visit | Attending: Obstetrics & Gynecology | Admitting: Obstetrics & Gynecology

## 2020-06-10 ENCOUNTER — Other Ambulatory Visit: Payer: Self-pay | Admitting: Obstetrics & Gynecology

## 2020-06-10 ENCOUNTER — Other Ambulatory Visit: Payer: Self-pay

## 2020-06-10 DIAGNOSIS — N649 Disorder of breast, unspecified: Secondary | ICD-10-CM

## 2020-06-10 DIAGNOSIS — N6320 Unspecified lump in the left breast, unspecified quadrant: Secondary | ICD-10-CM | POA: Diagnosis not present

## 2020-06-21 ENCOUNTER — Ambulatory Visit
Admission: RE | Admit: 2020-06-21 | Discharge: 2020-06-21 | Disposition: A | Discharge status: Home / Self Care | Payer: BC Managed Care – PPO | Source: Ambulatory Visit | Attending: Obstetrics & Gynecology | Admitting: Obstetrics & Gynecology

## 2020-06-21 ENCOUNTER — Other Ambulatory Visit: Payer: Self-pay

## 2020-06-21 ENCOUNTER — Other Ambulatory Visit: Payer: Self-pay | Admitting: Obstetrics & Gynecology

## 2020-06-21 DIAGNOSIS — N6012 Diffuse cystic mastopathy of left breast: Secondary | ICD-10-CM | POA: Diagnosis not present

## 2020-06-21 DIAGNOSIS — N649 Disorder of breast, unspecified: Secondary | ICD-10-CM

## 2020-06-21 DIAGNOSIS — N6342 Unspecified lump in left breast, subareolar: Secondary | ICD-10-CM | POA: Diagnosis not present

## 2020-06-26 DIAGNOSIS — Z20822 Contact with and (suspected) exposure to covid-19: Secondary | ICD-10-CM | POA: Diagnosis not present

## 2020-07-26 DIAGNOSIS — N6489 Other specified disorders of breast: Secondary | ICD-10-CM | POA: Diagnosis not present

## 2020-08-20 DIAGNOSIS — R509 Fever, unspecified: Secondary | ICD-10-CM | POA: Diagnosis not present

## 2020-11-05 ENCOUNTER — Ambulatory Visit: Payer: BC Managed Care – PPO | Admitting: Plastic Surgery

## 2020-11-05 ENCOUNTER — Encounter: Payer: Self-pay | Admitting: Plastic Surgery

## 2020-11-05 ENCOUNTER — Other Ambulatory Visit: Payer: Self-pay

## 2020-11-05 DIAGNOSIS — N6489 Other specified disorders of breast: Secondary | ICD-10-CM

## 2020-11-05 NOTE — Progress Notes (Signed)
Patient ID: Jennifer Price, female    DOB: [birth year], 30 y.o.   MRN: [id number]   Chief Complaint  Patient presents with  . consult  . Breast Problem    The patient is a 30 year old female here with mom for evaluation of her breasts.  She noticed a mass in her right breast several years ago.  She had it evaluated and it was being monitored.  Over the last several months it began to get much larger.  She went for further examination which included mammogram, ultrasound and biopsies.  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  She is seeing Dr. Carolynne Edouard and he is working on getting her on the schedule for removal.  She will need a partial mastectomy for the removal.  She is 5 feet 10 inches tall and weighs 183 pounds.  Her sternal to nipple distance on the right is 20 cm and on the left is 24 cm with a nipple to inframammary fold distance of 12 cm on the left.  The left breast is larger than the right.  The mass is palpable and noted on the left breast.  It seems to be more in the 3 to 9 o'clock position of the lower pole.  She is not a smoker and does not have diabetes.  She has some other medical conditions that are noted in the chart below.  She has 1 child.     Review of Systems  Constitutional: Negative for activity change and appetite change.  Eyes: Negative.   Respiratory: Negative for chest tightness and shortness of breath.   Cardiovascular: Negative for leg swelling.  Gastrointestinal: Negative for abdominal distention.  Endocrine: Negative.   Genitourinary: Negative.   Musculoskeletal: Negative.   Neurological: Negative.   Hematological: Negative.   Psychiatric/Behavioral: Negative.     Past Medical History:  Diagnosis Date  . No pertinent past medical history     Past Surgical History:  Procedure Laterality Date  . HERNIA REPAIR        Current Outpatient Medications:  .   levonorgestrel-ethinyl estradiol (ORSYTHIA) 0.1-20 MG-MCG tablet, Take 1 tablet by mouth daily., Disp: , Rfl:    Objective:   Vitals:   11/05/20 0835  BP: 137/90  Pulse: (!) 56  SpO2: 98%    Physical Exam Vitals and nursing note reviewed.  Constitutional:      Appearance: Normal appearance.  HENT:     Head: Normocephalic and atraumatic.  Cardiovascular:     Rate and Rhythm: Normal rate.     Pulses: Normal pulses.  Pulmonary:     Effort: Pulmonary effort is normal. No respiratory distress.     Breath sounds: No stridor.  Abdominal:     General: Abdomen is flat. There is no distension.     Tenderness: There is no abdominal tenderness.  Musculoskeletal:        General: No swelling, tenderness or deformity.  Skin:    General: Skin is warm.     Capillary Refill: Capillary refill takes less than 2 seconds.     Coloration: Skin is not jaundiced.     Findings: No bruising.  Neurological:     General: No focal deficit present.     Mental Status: She is alert and oriented to person, place, and time.  Psychiatric:        Mood and Affect: Mood normal.        Behavior: Behavior normal.  Thought Content: Thought content normal.     Assessment & Plan:  Pseudoangiomatous stromal hyperplasia of breast  We can plan an oncoplastic breast reduction on the left side so that she does not have a resulting massive deformity.  She can either have a reduction on the right side for symmetry or we can plan an implant on the left side to match the right side.  The patient is going to think about it and will do a telemetry visit for further discussion. I have a call placed into Dr. Carolynne Edouard to discuss the above information.  Pictures were obtained of the patient and placed in the chart with the patient's or guardian's permission.   Alena Bills Teona Vargus, DO

## 2020-11-16 ENCOUNTER — Other Ambulatory Visit: Payer: Self-pay

## 2020-11-16 ENCOUNTER — Telehealth (INDEPENDENT_AMBULATORY_CARE_PROVIDER_SITE_OTHER): Payer: BC Managed Care – PPO | Admitting: Plastic Surgery

## 2020-11-16 DIAGNOSIS — N6489 Other specified disorders of breast: Secondary | ICD-10-CM

## 2020-11-16 NOTE — Progress Notes (Signed)
The patient is a 30 year old female joining me for a telemetry visit.  She was diagnosed with a pseudoangiomatous tumor (PASH) of the left breast.  She is planning on undergoing excision of it with Dr. Carolynne Edouard.  Most likely it will be a partial mastectomy.  She is 5 feet 10 inches tall weighs 183 pounds.  She is thinking about whether or not to reduce both sides or do an implant on the partial mastectomy side.  She is not a smoker and does not have diabetes. The patient does not want to have an implant.  She would like to make the right breast match the left for what ever size she has left.  But she wants to focus on the left and right now and wait to do the right side.  I will let Dr. Carolynne Edouard know.

## 2020-12-07 ENCOUNTER — Ambulatory Visit: Payer: Self-pay | Admitting: General Surgery

## 2020-12-07 DIAGNOSIS — N6489 Other specified disorders of breast: Secondary | ICD-10-CM

## 2020-12-08 ENCOUNTER — Other Ambulatory Visit: Payer: Self-pay | Admitting: General Surgery

## 2020-12-08 DIAGNOSIS — N6489 Other specified disorders of breast: Secondary | ICD-10-CM

## 2021-01-11 ENCOUNTER — Encounter: Payer: BC Managed Care – PPO | Admitting: Surgical

## 2021-01-12 ENCOUNTER — Encounter: Payer: Self-pay | Admitting: Surgical

## 2021-01-12 ENCOUNTER — Ambulatory Visit (INDEPENDENT_AMBULATORY_CARE_PROVIDER_SITE_OTHER): Payer: BC Managed Care – PPO | Admitting: Surgical

## 2021-01-12 ENCOUNTER — Other Ambulatory Visit: Payer: Self-pay

## 2021-01-12 VITALS — BP 132/80 | HR 73 | Ht 70.0 in | Wt 179.0 lb

## 2021-01-12 DIAGNOSIS — N6489 Other specified disorders of breast: Secondary | ICD-10-CM

## 2021-01-12 NOTE — Progress Notes (Signed)
Patient ID: Jennifer Price, female    DOB: 08-13-1990, 30 y.o.   MRN: [id number]  Chief Complaint  Patient presents with  . Pre-op Exam      ICD-10-CM   1. Pseudoangiomatous stromal hyperplasia of breast  N64.89      History of Present Illness: Jennifer Price is a 30 y.o.  female  with a history of pseudoangiomatous tumor of the left breast.  She presents for preoperative evaluation for upcoming procedure, immediate unilateral left breast oncoplastic reduction, scheduled for 02/03/2021 with Dr. Ulice Bold after left breast lumpectomy with radioactive seed localization by Dr. Carolynne Edouard on 02/03/2021.  The patient has not had problems with anesthesia. No history of DVT/PE.  No family history of DVT/PE.  No family or personal history of bleeding or clotting disorders.  Patient is not currently taking any blood thinners.  No history of CVA/MI.   Summary of Previous Visit: Patient has decided on unilateral left breast oncoplastic reduction at this time and would like to avoid any surgery on the right breast at this point in time.  She is not interested in implant.  She is not a smoker and not a diabetic.  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  Past Medical History: Allergies: No Known Allergies  Current Medications:  Current Outpatient Medications:  .  levonorgestrel-ethinyl estradiol (ORSYTHIA) 0.1-20 MG-MCG tablet, Take 1 tablet by mouth daily., Disp: , Rfl:   Past Medical Problems: Past Medical History:  Diagnosis Date  . No pertinent past medical history     Past Surgical History: Past Surgical History:  Procedure Laterality Date  . HERNIA REPAIR      Social History: Social History   Socioeconomic History  . Marital status: Single    Spouse name: Not on file  . Number of children: Not on file  . Years of education: Not on file  . Highest education level: Not on file  Occupational History  . Not on file  Tobacco  Use  . Smoking status: Never Smoker  . Smokeless tobacco: Never Used  Substance and Sexual Activity  . Alcohol use: No  . Drug use: No  . Sexual activity: Yes  Other Topics Concern  . Not on file  Social History Narrative  . Not on file   Social Determinants of Health   Financial Resource Strain: Not on file  Food Insecurity: Not on file  Transportation Needs: Not on file  Physical Activity: Not on file  Stress: Not on file  Social Connections: Not on file  Intimate Partner Violence: Not on file    Family History: Family History  Problem Relation Age of Onset  . Hypertension Mother   . Hypertension Father   . Cancer Father   . Diabetes Maternal Aunt   . Cancer Maternal Grandfather     Review of Systems: Review of Systems  Constitutional: Negative.   Respiratory: Negative.   Cardiovascular: Negative.   Gastrointestinal: Negative.   Neurological: Negative.     Physical Exam: Vital Signs BP 132/80 (BP Location: Right Arm, Patient Position: Sitting, Cuff Size: Normal)   Pulse 73   Ht 5\' 10"  (1.778 m)   Wt 179 lb (81.2 kg)   SpO2 99%   BMI 25.68 kg/m   Physical Exam Constitutional:      General: Not in acute distress.    Appearance: Normal appearance. Not ill-appearing.  HENT:     Head: Normocephalic and atraumatic.  Eyes:     Pupils: Pupils are equal, round Neck:     Musculoskeletal: Normal range of motion.  Cardiovascular:     Rate and Rhythm: Normal rate    Pulses: Normal pulses.  Pulmonary:     Effort: Pulmonary effort is normal. No respiratory distress.  Abdominal:     General: Abdomen is flat. There is no distension.  Musculoskeletal: Normal range of motion.  Skin:    General: Skin is warm and dry.     Findings: No erythema or rash.  Neurological:     General: No focal deficit present.     Mental Status: Alert and oriented to person, place, and time. Mental status is at baseline.     Motor: No weakness.  Psychiatric:        Mood and  Affect: Mood normal.        Behavior: Behavior normal.    Assessment/Plan: The patient is scheduled for unilateral left breast oncoplastic reduction with Dr. Ulice Bold after left breast lumpectomy with Dr. Carolynne Edouard.  Risks, benefits, and alternatives of procedure discussed, questions answered and consent obtained.    Smoking Status: Non-smoker; Counseling Given?  N/A Last Mammogram: Ultrasound of left breast on 06/10/2020 with subsequent left breast biopsy on 06/21/2020 which showed PASH  Caprini Score: 3, moderate; Risk Factors include: On oral contraceptive pills and length of planned surgery. Recommendation for mechanical and pharmacological prophylaxis while hospitalized. Encourage early ambulation.   Pictures obtained:@Consult  on 11/05/2020  Post-op Rx sent to pharmacy: Norco, Zofran, Keflex  Patient was provided with the breast reduction and General Surgical Risk consent document and Pain Medication Agreement prior to their appointment.  They had adequate time to read through the risk consent documents and Pain Medication Agreement. We also discussed them in person together during this preop appointment. All of their questions were answered to their satisfaction.  Recommended calling if they have any further questions.  Risk consent form and Pain Medication Agreement to be scanned into patient's chart.  The risk that can be encountered with breast reduction were discussed and include the following but not limited to these:  Breast asymmetry, fluid accumulation, firmness of the breast, inability to breast feed, loss of nipple or areola, skin loss, decrease or no nipple sensation, fat necrosis of the breast tissue, bleeding, infection, healing delay.  There are risks of anesthesia, changes to skin sensation and injury to nerves or blood vessels.  The muscle can be temporarily or permanently injured.  You may have an allergic reaction to tape, suture, glue, blood products which can result in skin  discoloration, swelling, pain, skin lesions, poor healing.  Any of these can lead to the need for revisonal surgery or stage procedures.  A reduction has potential to interfere with diagnostic procedures.  Nipple or breast piercing can increase risks of infection.  This procedure is best done when the breast is fully developed.  Changes in the breast will continue to occur over time.  Pregnancy can alter the outcomes of previous breast reduction surgery, weight gain and weigh loss can also effect the long term appearance.    Electronically signed by: Kermit Balo Xitlalli Newhard, PA-C 01/12/2021 11:50 AM

## 2021-01-14 ENCOUNTER — Encounter: Payer: BC Managed Care – PPO | Admitting: Surgical

## 2021-01-17 ENCOUNTER — Telehealth: Payer: Self-pay | Admitting: Plastic Surgery

## 2021-01-17 ENCOUNTER — Other Ambulatory Visit: Payer: Self-pay

## 2021-01-17 ENCOUNTER — Encounter (HOSPITAL_BASED_OUTPATIENT_CLINIC_OR_DEPARTMENT_OTHER): Payer: Self-pay | Admitting: General Surgery

## 2021-01-17 NOTE — Telephone Encounter (Signed)
Received call from the patient this morning. She took a home pregnancy test this weekend, which indicates that she is pregnant. She would like to know how to proceed with the planned surgery. I advised that I will reach out to Dr. Ulice Bold and Dr. Carolynne Edouard (office staff) to advise on next steps. Patient voiced understanding and agreement, and will await a call from our office(s).

## 2021-01-18 ENCOUNTER — Telehealth: Payer: Self-pay | Admitting: Plastic Surgery

## 2021-01-18 NOTE — Telephone Encounter (Signed)
Called patient to discuss surgical options of timing.  We will also reach out to Dr. Carolynne Edouard.  Patient planning on termination of pregnancy at the end of the week.  We will get back to her as soon as I hear from Dr. Carolynne Edouard.

## 2021-01-25 DIAGNOSIS — R002 Palpitations: Secondary | ICD-10-CM | POA: Diagnosis not present

## 2021-01-25 DIAGNOSIS — N925 Other specified irregular menstruation: Secondary | ICD-10-CM | POA: Diagnosis not present

## 2021-01-25 DIAGNOSIS — N6320 Unspecified lump in the left breast, unspecified quadrant: Secondary | ICD-10-CM | POA: Diagnosis not present

## 2021-01-25 DIAGNOSIS — Z6827 Body mass index (BMI) 27.0-27.9, adult: Secondary | ICD-10-CM | POA: Diagnosis not present

## 2021-01-25 DIAGNOSIS — Z01411 Encounter for gynecological examination (general) (routine) with abnormal findings: Secondary | ICD-10-CM | POA: Diagnosis not present

## 2021-01-25 DIAGNOSIS — Z113 Encounter for screening for infections with a predominantly sexual mode of transmission: Secondary | ICD-10-CM | POA: Diagnosis not present

## 2021-01-25 DIAGNOSIS — Z124 Encounter for screening for malignant neoplasm of cervix: Secondary | ICD-10-CM | POA: Diagnosis not present

## 2021-01-25 LAB — HM PAP SMEAR

## 2021-01-25 LAB — OB RESULTS CONSOLE HIV ANTIBODY (ROUTINE TESTING): HIV: NONREACTIVE

## 2021-01-25 LAB — OB RESULTS CONSOLE HEPATITIS B SURFACE ANTIGEN: Hepatitis B Surface Ag: NEGATIVE

## 2021-01-25 LAB — OB RESULTS CONSOLE RUBELLA ANTIBODY, IGM: Rubella: IMMUNE

## 2021-01-25 LAB — RESULTS CONSOLE HPV: CHL HPV: NEGATIVE

## 2021-01-27 DIAGNOSIS — A5600 Chlamydial infection of lower genitourinary tract, unspecified: Secondary | ICD-10-CM | POA: Diagnosis not present

## 2021-01-27 LAB — OB RESULTS CONSOLE GC/CHLAMYDIA
Chlamydia: POSITIVE
Gonorrhea: NEGATIVE

## 2021-01-28 ENCOUNTER — Telehealth: Payer: Self-pay | Admitting: Plastic Surgery

## 2021-01-28 NOTE — Telephone Encounter (Signed)
Per patient- she is deciding to continue with her pregnancy. Surgery date is 6/30.This information has been relayed to Dr. Ulice Bold by Osvaldo Human D, and Dr. Ulice Bold will discuss with Dr. Carolynne Edouard.

## 2021-01-31 ENCOUNTER — Other Ambulatory Visit (HOSPITAL_COMMUNITY): Payer: BC Managed Care – PPO

## 2021-02-03 ENCOUNTER — Ambulatory Visit (HOSPITAL_BASED_OUTPATIENT_CLINIC_OR_DEPARTMENT_OTHER): Admission: RE | Admit: 2021-02-03 | Payer: BC Managed Care – PPO | Source: Home / Self Care | Admitting: General Surgery

## 2021-02-03 HISTORY — DX: Anxiety disorder, unspecified: F41.9

## 2021-02-03 HISTORY — DX: Unspecified lump in unspecified breast: N63.0

## 2021-02-03 SURGERY — BREAST LUMPECTOMY WITH RADIOACTIVE SEED LOCALIZATION
Anesthesia: General | Site: Breast | Laterality: Left

## 2021-02-11 ENCOUNTER — Encounter: Payer: BC Managed Care – PPO | Admitting: Plastic Surgery

## 2021-02-24 DIAGNOSIS — R8271 Bacteriuria: Secondary | ICD-10-CM | POA: Diagnosis not present

## 2021-02-24 DIAGNOSIS — Z113 Encounter for screening for infections with a predominantly sexual mode of transmission: Secondary | ICD-10-CM | POA: Diagnosis not present

## 2021-02-24 DIAGNOSIS — Z3491 Encounter for supervision of normal pregnancy, unspecified, first trimester: Secondary | ICD-10-CM | POA: Diagnosis not present

## 2021-02-25 ENCOUNTER — Encounter: Payer: BC Managed Care – PPO | Admitting: Surgical

## 2021-04-18 DIAGNOSIS — R609 Edema, unspecified: Secondary | ICD-10-CM | POA: Diagnosis not present

## 2021-04-22 DIAGNOSIS — Z3A21 21 weeks gestation of pregnancy: Secondary | ICD-10-CM | POA: Diagnosis not present

## 2021-04-22 DIAGNOSIS — Z3686 Encounter for antenatal screening for cervical length: Secondary | ICD-10-CM | POA: Diagnosis not present

## 2021-04-22 DIAGNOSIS — Z363 Encounter for antenatal screening for malformations: Secondary | ICD-10-CM | POA: Diagnosis not present

## 2021-05-20 DIAGNOSIS — Z3491 Encounter for supervision of normal pregnancy, unspecified, first trimester: Secondary | ICD-10-CM | POA: Diagnosis not present

## 2021-05-29 IMAGING — US US BREAST*L* LIMITED INC AXILLA
1 series · 13 of 13 positions shown · non-contrast
Comparison: Previous exam(s).

CLINICAL DATA: 29-year-old female presenting for evaluation of a
palpable lump in the left breast. She has had this evaluated on 2
prior occasions, but feels that this has increased in size. She is
having pain in the inferior to lower outer aspect of the left
breast.

EXAM:
ULTRASOUND OF THE LEFT BREAST

[Series 1: us breast*left* limited inc axilla · 0.15mm/px · 13 of 13 slices shown]
[im 1/13]
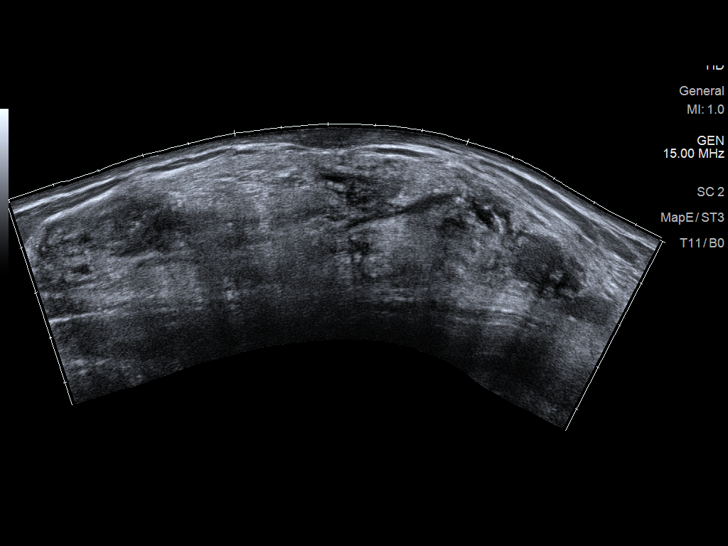
[im 2/13]
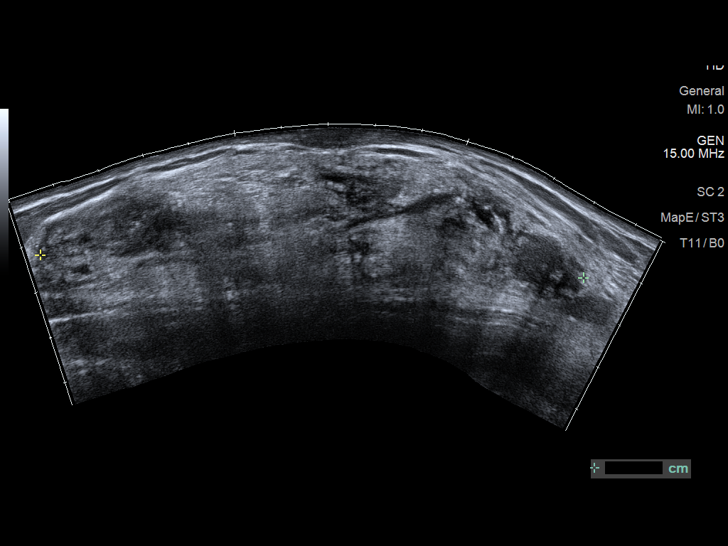
[im 3/13]
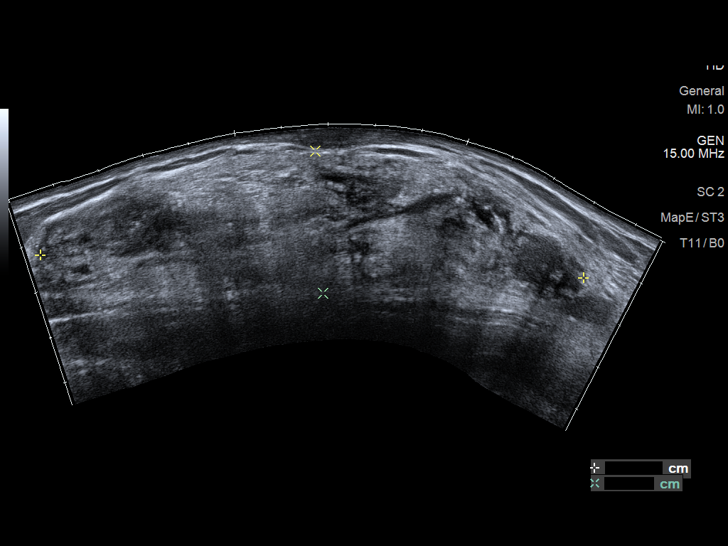
[im 4/13]
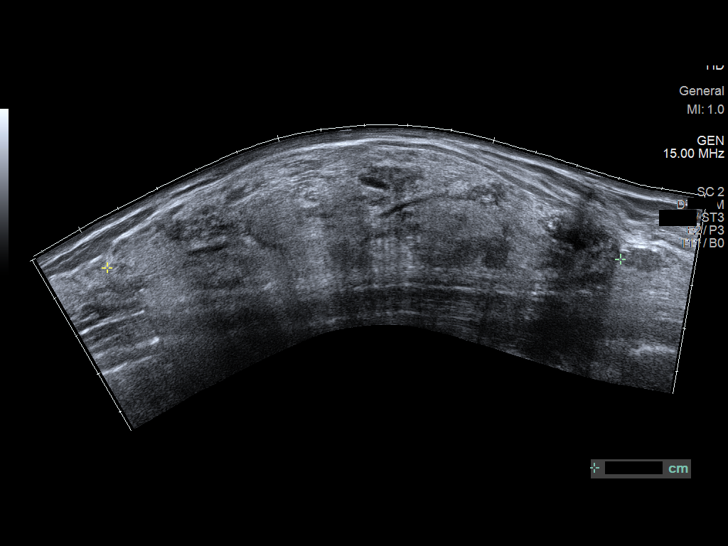
[im 5/13]
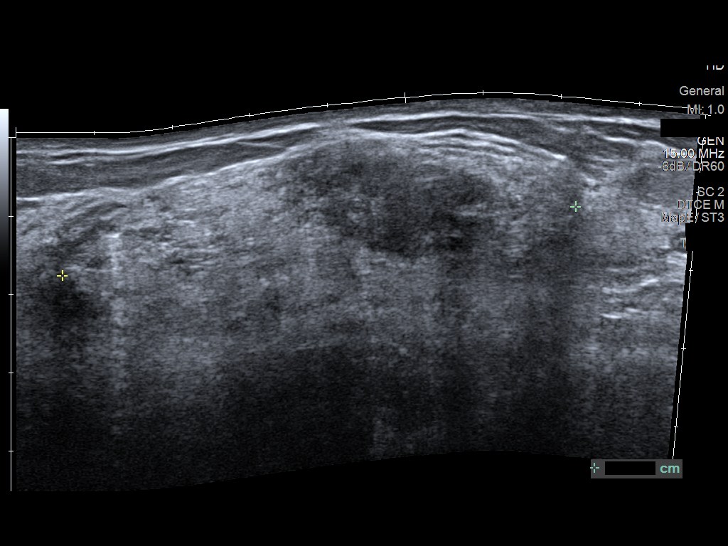
[im 6/13]
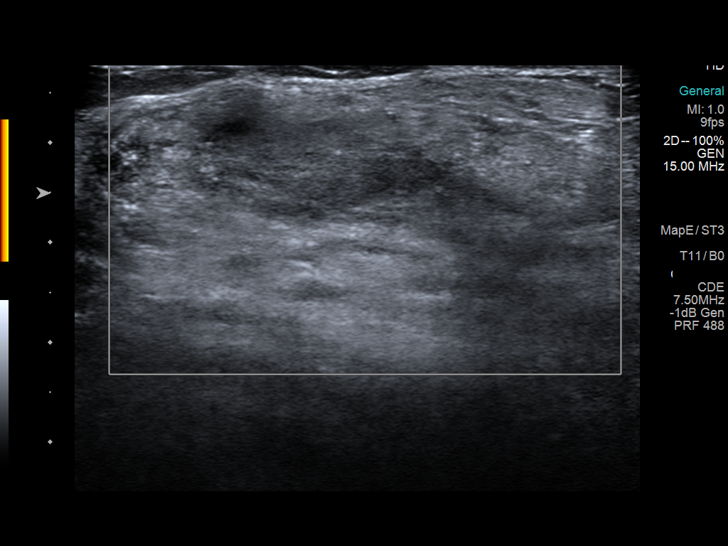
[im 7/13]
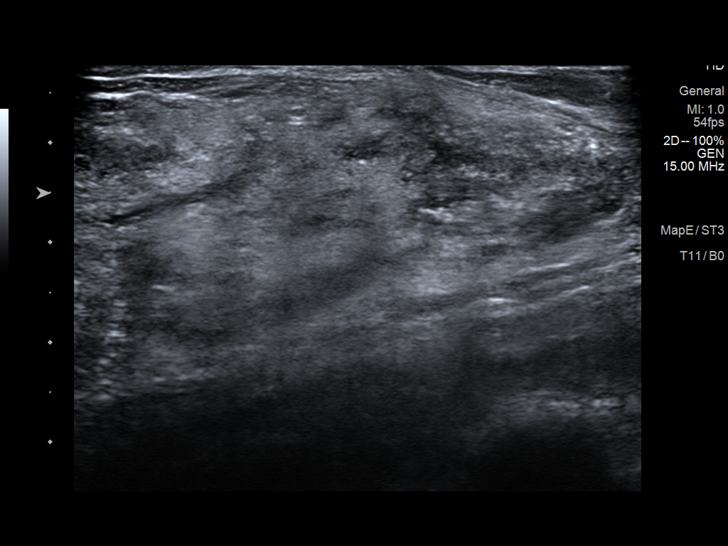
[im 8/13]
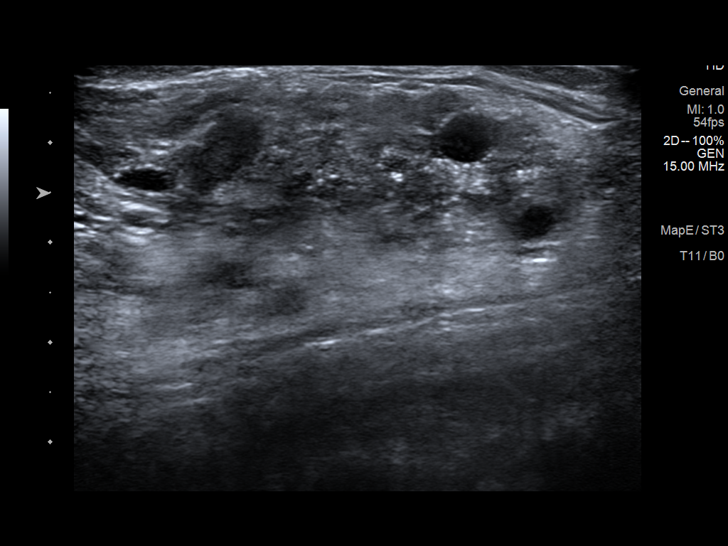
[im 9/13]
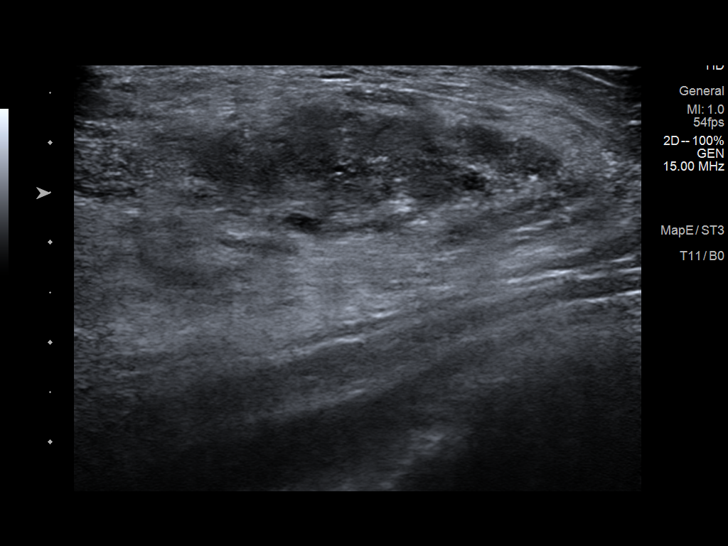
[im 10/13]
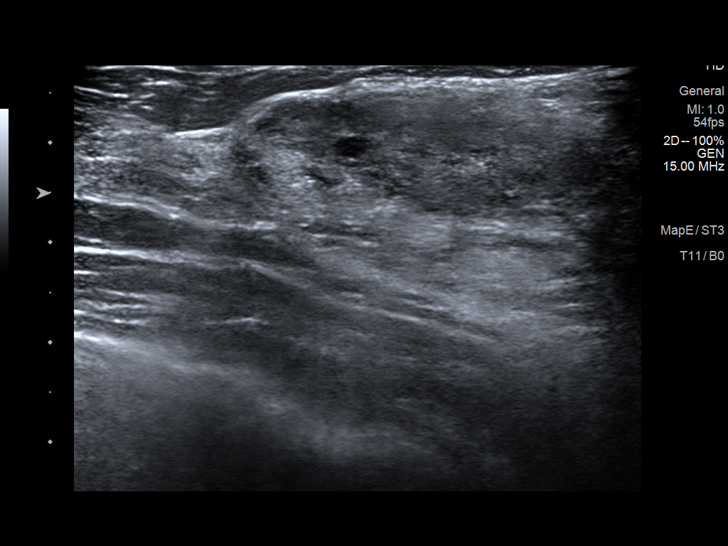
[im 11/13]
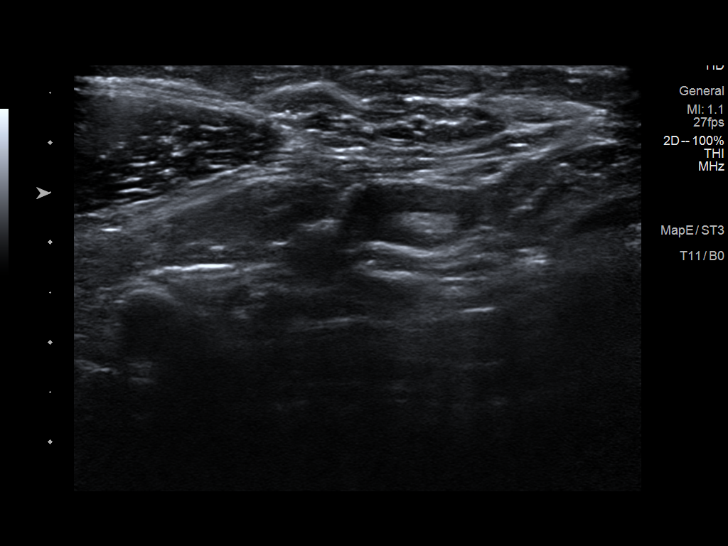
[im 12/13]
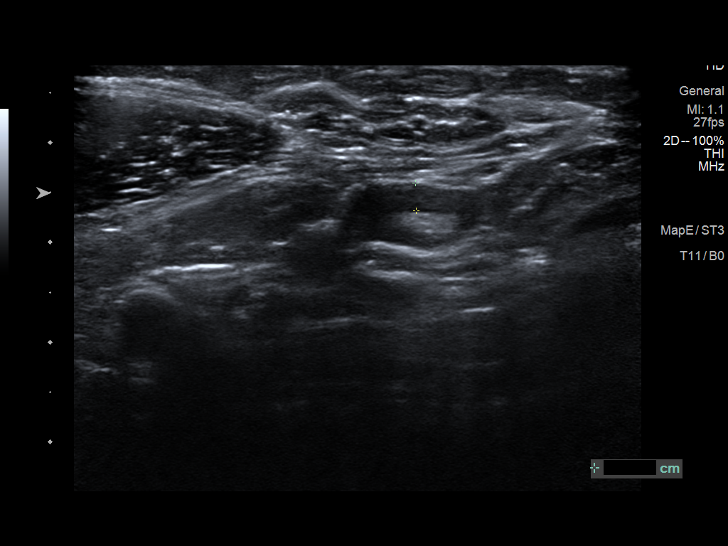
[im 13/13]
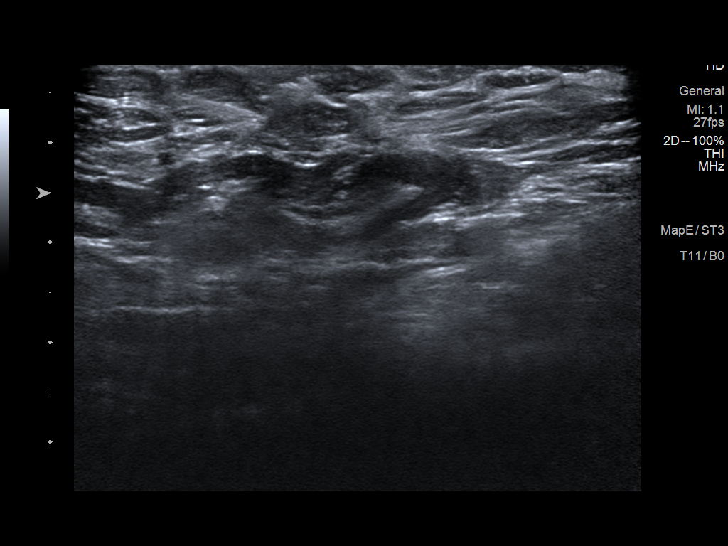

[13 of 13 positions shown; findings below may reference images not displayed]

FINDINGS: On physical exam, there is a soft fullness in the inferior aspect of
the left breast.

Ultrasound targeted to the palpable site in the inferior left breast
demonstrates a large heterogeneous oval mass measuring 11.5 x 3.0 x
6.6 cm. In 8528, the mass measured 5.0 x 1.4 x 4.6 cm. Ultrasound of
the left axilla demonstrates multiple normal-appearing lymph nodes.
IMPRESSION: 1. Continued increase in size of the mass in the inferior left
breast.

2.  No evidence of left axillary lymphadenopathy.

RECOMMENDATION:
Ultrasound guided biopsy is recommended for 2 distant sites within
the mass in the inferior left breast due to its large size. The
procedure has been scheduled for 06/21/2020 at [DATE] a.m. The
patient is possibly interested in surgical consultation following
biopsy.

I have discussed the findings and recommendations with the patient.
If applicable, a reminder letter will be sent to the patient
regarding the next appointment.

BI-RADS CATEGORY  4: Suspicious.

## 2021-06-09 IMAGING — US US BREAST BX W LOC DEV 1ST LESION IMG BX SPEC US GUIDE*L*
1 series · 12 of 25 positions shown · non-contrast
Comparison: Previous exam(s).
COMPARISON: Previous exam(s).

Addendum:
CLINICAL DATA: Biopsy of a growing left breast mass. The medial
aspect of the mass at 9 o'clock was biopsied as was the 3 o'clock
lateral aspect of the mass.

EXAM:
ULTRASOUND GUIDED LEFT BREAST CORE NEEDLE BIOPSY

[Series 1: us breast bx w loc dev 1st lesion img bx spec us g · 0.07mm/px · 12 of 25 slices shown]
[im 2/25]
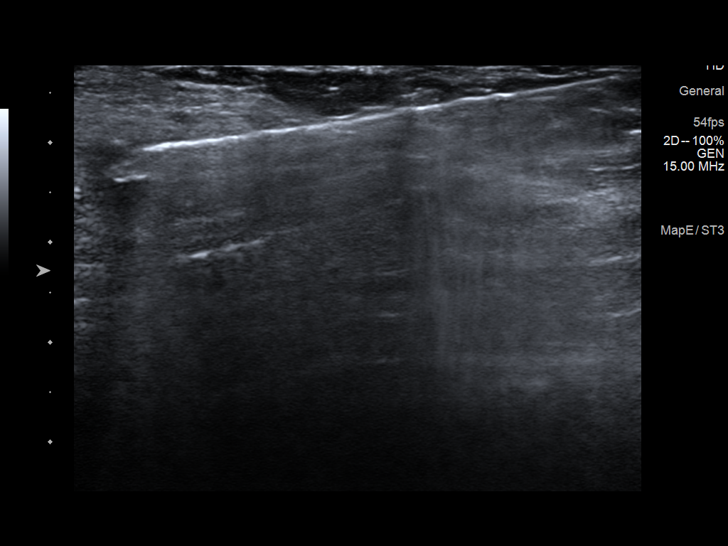
[im 4/25]
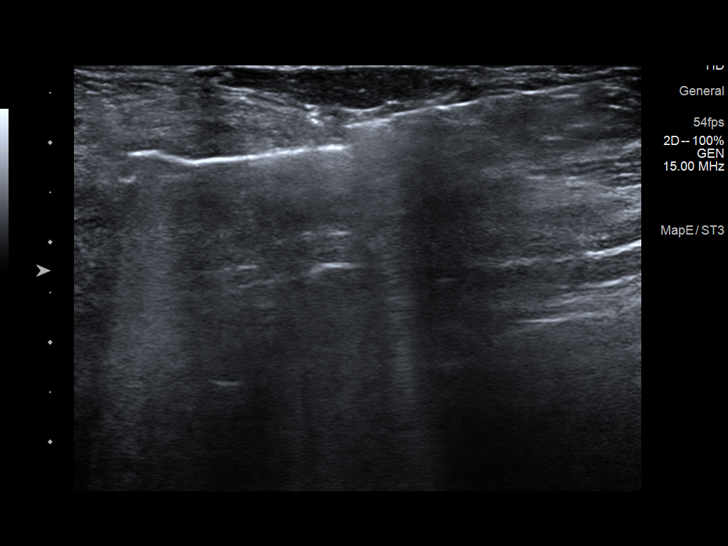
[im 6/25]
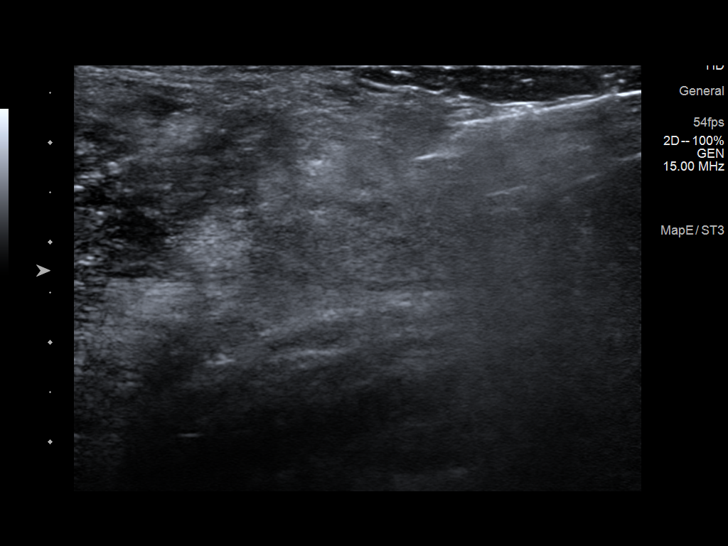
[im 8/25]
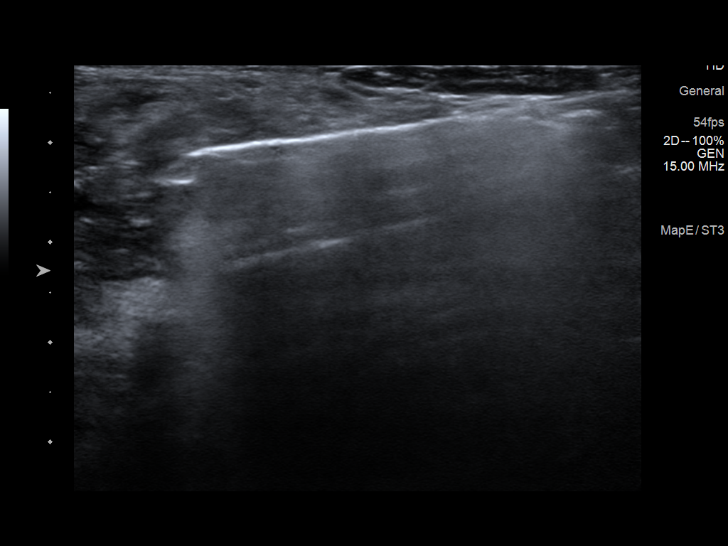
[im 10/25]
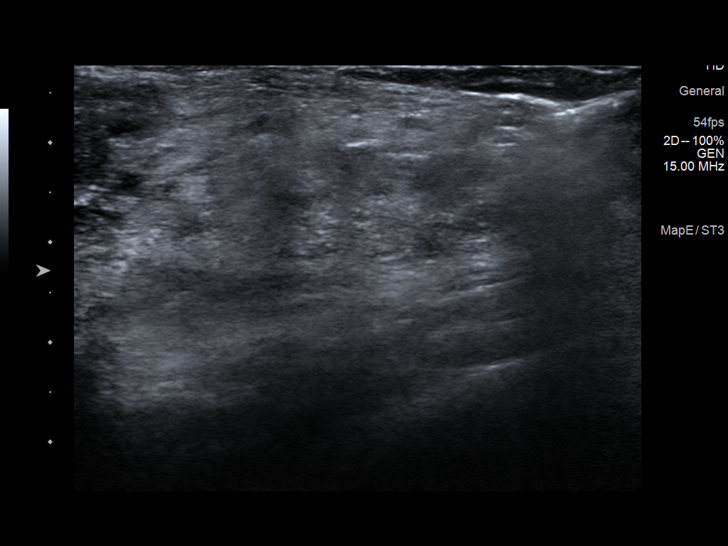
[im 12/25]
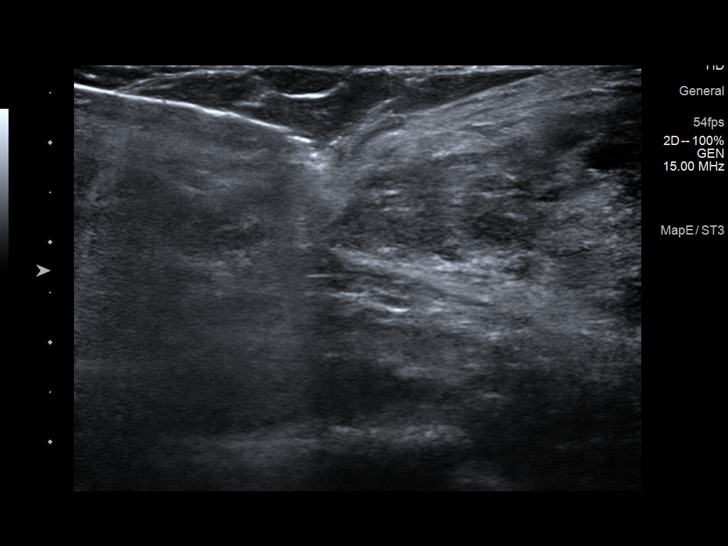
[im 14/25]
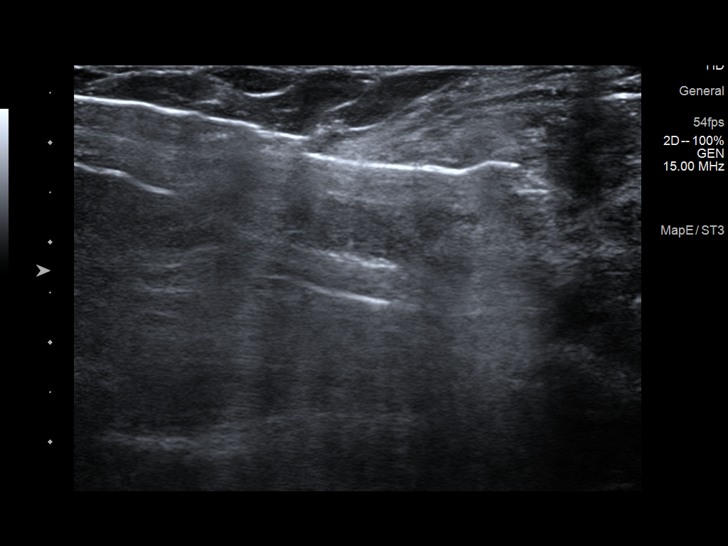
[im 16/25]
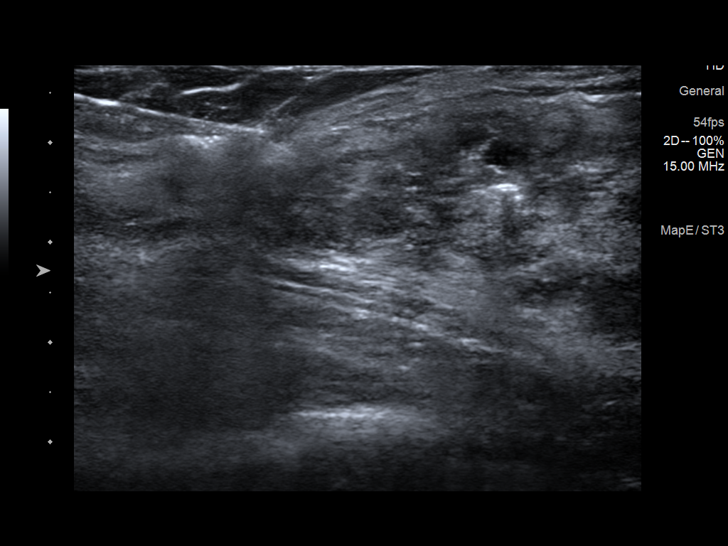
[im 18/25]
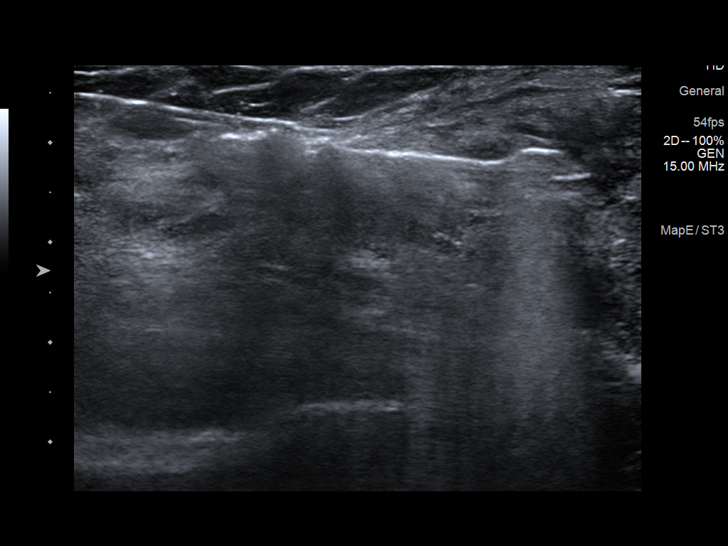
[im 20/25]
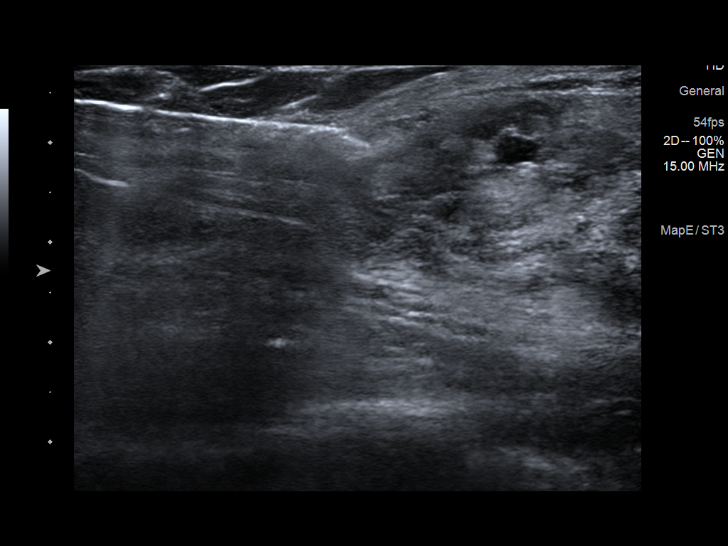
[im 22/25]
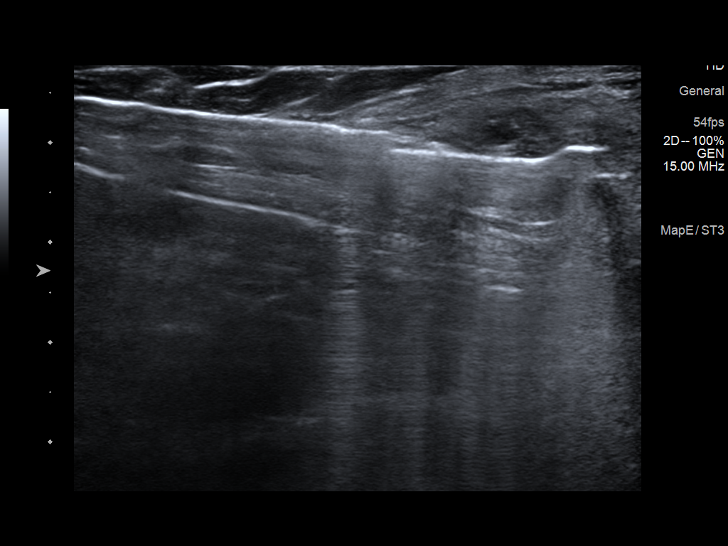
[im 24/25]
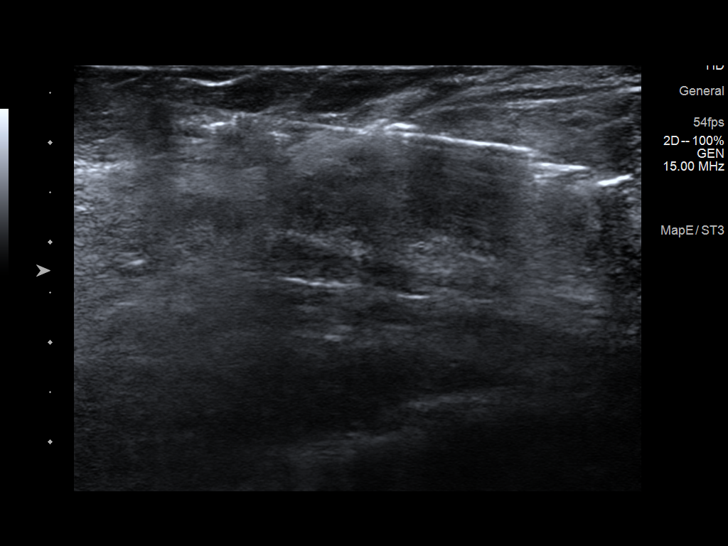

[12 of 25 positions shown; findings below may reference images not displayed]



Lesion quadrant: 9 o'clock left breast

Using sterile technique and 1% Lidocaine as local anesthetic, under
direct ultrasound visualization, a 12 gauge Tuzo device was
used to perform biopsy of the 9 o'clock aspect the left breast
retroareolar mass using a medial approach. At the conclusion of the
procedure a ribbon shaped tissue marker clip was deployed into the
biopsy cavity. Follow up 2 view mammogram was performed and dictated
separately.

Lesion quadrant: 3 o'clock left breast

Using sterile technique and 1% Lidocaine as local anesthetic, under
direct ultrasound visualization, a 12 o'clock gauge Tuzo
device was used to perform biopsy of the 3 o'clock aspect of the
left breast retroareolar mass using a lateral approach. At the
conclusion of the procedure a coil shaped tissue marker clip was
deployed into the biopsy cavity. Follow up 2 view mammogram was
performed and dictated separately.
IMPRESSION: Ultrasound guided biopsy of the 9 o'clock and 3 o'clock aspects of
the large left breast mass. No apparent complications.

ADDENDUM:
Pathology revealed FIBROCYSTIC CHANGES WITH CALCIFICATIONS,
PSEUDOANGIOMATOUS STROMAL HYPERPLASIA (PASH) of the LEFT breast, 9
o'clock. This was found to be concordant by Dr. Jean Theophile Defeu, with
surgical consultation to consider excision recommended.

Pathology revealed FIBROCYSTIC CHANGES WITH CALCIFICATIONS,
PSEUDOANGIOMATOUS STROMAL HYPERPLASIA (PASH) of the LEFT breast, 3
o'clock. This was found to be concordant by Dr. Jean Theophile Defeu, with
surgical consultation to consider excision recommended.

Pathology results were discussed with the patient by telephone. The
patient reported doing well after the biopsies with tenderness at
the sites. Post biopsy instructions and care were reviewed and
questions were answered. The patient was encouraged to call The

Surgical consultation has been arranged with Dr. Niesha Ramirez at
[REDACTED] on July 26, 2020.

Pathology results reported by Oscarin Boden RN on 06/22/2020.



Lesion quadrant: 9 o'clock left breast

Using sterile technique and 1% Lidocaine as local anesthetic, under
direct ultrasound visualization, a 12 gauge Tuzo device was
used to perform biopsy of the 9 o'clock aspect the left breast
retroareolar mass using a medial approach. At the conclusion of the
procedure a ribbon shaped tissue marker clip was deployed into the
biopsy cavity. Follow up 2 view mammogram was performed and dictated
separately.

Lesion quadrant: 3 o'clock left breast

Using sterile technique and 1% Lidocaine as local anesthetic, under
direct ultrasound visualization, a 12 o'clock gauge Tuzo
device was used to perform biopsy of the 3 o'clock aspect of the
left breast retroareolar mass using a lateral approach. At the
conclusion of the procedure a coil shaped tissue marker clip was
deployed into the biopsy cavity. Follow up 2 view mammogram was
performed and dictated separately.
IMPRESSION: Ultrasound guided biopsy of the 9 o'clock and 3 o'clock aspects of
the large left breast mass. No apparent complications.

## 2021-07-26 DIAGNOSIS — D75839 Thrombocytosis, unspecified: Secondary | ICD-10-CM | POA: Diagnosis not present

## 2021-08-04 DIAGNOSIS — Z3491 Encounter for supervision of normal pregnancy, unspecified, first trimester: Secondary | ICD-10-CM | POA: Diagnosis not present

## 2021-08-04 LAB — OB RESULTS CONSOLE GBS: GBS: NEGATIVE

## 2021-08-07 NOTE — L&D Delivery Note (Signed)
Vaginal Delivery Note -Shoulder  Patient pushed for less than 5 minutes after she was noted to be C/C/+2. Guided pushing with maternal urge and regular contractions. At 9:04 AM a viable and healthy female was delivered via Vaginal, Spontaneous (Presentation: Right Occiput Anterior).  APGAR: 9, 9; weight  pending.   Placenta status: Spontaneous, Intact.  Cord: 3 vessels with the following complications: None.  After head was delivered, the Anterior shoulder did not immediately deliver with gentle downward traction and a shoulder dystocia was called. Simultaneous mcRoberts / suprapubic pressure was performed then the shoulder was then delivered followed by the body w/o difficulty and the Baby placed on the maternal abdomen. The cord was double clamped and cut by Grandmother. The baby noted to be crying vigorously and moving all four extremities and there  Was no notable defect after the shoulder dystocia.  Cord blood obtained.   Placenta spontaneously delivered intact with trailing membranes,   Uterine atony alleviated by IV pitocin and massage.  There was a left labia majora laceration noted and this was repaired with 3-0 chromic.   Patient tolerated delivery well, there were no complications.    Delivery Details: Delivery Type: NSVD  Anesthesia IV Fentanyl and 1% lidocaine plain  Episiotomy:  None  Lacerations:  Left labial  Repair suture:  3-0 chromic (SH)  Blood loss (ml):  100   Birth information: Date of birth:   [birth year]  Time of birth:    09:04 am  Sex:    female   Name:    Jennifer Price  APGAR APGAR (1 MIN): 9   APGAR (5 MINS): 9   APGAR (10 MINS):    Weight  Pending   Resuscitation:    Drying / Stim  Cord information:    Disposition of cord blood:  L&D   Blood gases sent?  N Complications:    None  Placenta: Delivered:   Intact    appearance  3VC   Disposition: Mom to postpartum.  Baby to Couplet care / Skin to Skin.  Essie Hart MD 08/30/2021, 10:03 AM

## 2021-08-11 LAB — OB RESULTS CONSOLE GC/CHLAMYDIA
Chlamydia: NEGATIVE
Gonorrhea: NEGATIVE

## 2021-08-30 ENCOUNTER — Inpatient Hospital Stay (HOSPITAL_COMMUNITY)
Admission: AD | Admit: 2021-08-30 | Discharge: 2021-08-31 | DRG: 807 | Disposition: A | Discharge status: Home / Self Care | Payer: Medicaid Other | Attending: Obstetrics & Gynecology | Admitting: Obstetrics & Gynecology

## 2021-08-30 ENCOUNTER — Encounter (HOSPITAL_COMMUNITY): Payer: Self-pay | Admitting: Obstetrics & Gynecology

## 2021-08-30 ENCOUNTER — Inpatient Hospital Stay (HOSPITAL_COMMUNITY): Admit: 2021-08-30 | Payer: Medicaid Other | Admitting: Obstetrics & Gynecology

## 2021-08-30 DIAGNOSIS — Z20822 Contact with and (suspected) exposure to covid-19: Secondary | ICD-10-CM | POA: Diagnosis present

## 2021-08-30 DIAGNOSIS — O4292 Full-term premature rupture of membranes, unspecified as to length of time between rupture and onset of labor: Principal | ICD-10-CM | POA: Diagnosis present

## 2021-08-30 DIAGNOSIS — O480 Post-term pregnancy: Secondary | ICD-10-CM

## 2021-08-30 DIAGNOSIS — Z3A40 40 weeks gestation of pregnancy: Secondary | ICD-10-CM

## 2021-08-30 DIAGNOSIS — O4202 Full-term premature rupture of membranes, onset of labor within 24 hours of rupture: Secondary | ICD-10-CM

## 2021-08-30 LAB — CBC
HCT: 31.5 % — ABNORMAL LOW (ref 36.0–46.0)
Hemoglobin: 10 g/dL — ABNORMAL LOW (ref 12.0–15.0)
MCH: 27.7 pg (ref 26.0–34.0)
MCHC: 31.7 g/dL (ref 30.0–36.0)
MCV: 87.3 fL (ref 80.0–100.0)
Platelets: 459 10*3/uL — ABNORMAL HIGH (ref 150–400)
RBC: 3.61 MIL/uL — ABNORMAL LOW (ref 3.87–5.11)
RDW: 15.1 % (ref 11.5–15.5)
WBC: 14.6 10*3/uL — ABNORMAL HIGH (ref 4.0–10.5)
nRBC: 0 % (ref 0.0–0.2)

## 2021-08-30 LAB — POCT FERN TEST

## 2021-08-30 LAB — TYPE AND SCREEN
ABO/RH(D): O POS
Antibody Screen: NEGATIVE

## 2021-08-30 LAB — RPR: RPR Ser Ql: NONREACTIVE

## 2021-08-30 LAB — RESP PANEL BY RT-PCR (FLU A&B, COVID) ARPGX2
Influenza A by PCR: NEGATIVE
Influenza B by PCR: NEGATIVE
SARS Coronavirus 2 by RT PCR: NEGATIVE

## 2021-08-30 MED ORDER — WITCH HAZEL-GLYCERIN EX PADS: 1.0000 "application " | MEDICATED_PAD | CUTANEOUS | Status: DC | PRN

## 2021-08-30 MED ORDER — FENTANYL CITRATE (PF) 100 MCG/2ML IJ SOLN
100.0000 ug | INTRAMUSCULAR | Status: DC | PRN
  Administered 2021-08-30: 08:00:00 100 ug via INTRAVENOUS

## 2021-08-30 MED ORDER — OXYCODONE-ACETAMINOPHEN 5-325 MG PO TABS: 1.0000 | ORAL_TABLET | ORAL | Status: DC | PRN

## 2021-08-30 MED ORDER — SOD CITRATE-CITRIC ACID 500-334 MG/5ML PO SOLN: 30.0000 mL | ORAL | Status: DC | PRN

## 2021-08-30 MED ORDER — FENTANYL CITRATE (PF) 100 MCG/2ML IJ SOLN
INTRAMUSCULAR | Status: AC
  Filled 2021-08-30: qty 2

## 2021-08-30 MED ORDER — IBUPROFEN 600 MG PO TABS
600.0000 mg | ORAL_TABLET | Freq: Four times a day (QID) | ORAL | Status: DC
  Administered 2021-08-30 – 2021-08-31 (×5): 600 mg via ORAL
  Filled 2021-08-30 (×5): qty 1

## 2021-08-30 MED ORDER — ONDANSETRON HCL 4 MG PO TABS: 4.0000 mg | ORAL_TABLET | ORAL | Status: DC | PRN

## 2021-08-30 MED ORDER — LACTATED RINGERS IV SOLN
INTRAVENOUS | Status: DC
  Administered 2021-08-30: 03:00:00 1000 mL via INTRAVENOUS

## 2021-08-30 MED ORDER — OXYTOCIN-SODIUM CHLORIDE 30-0.9 UT/500ML-% IV SOLN: 2.5000 [IU]/h | INTRAVENOUS | Status: DC | PRN

## 2021-08-30 MED ORDER — LIDOCAINE HCL (PF) 1 % IJ SOLN
30.0000 mL | INTRAMUSCULAR | Status: AC | PRN
  Administered 2021-08-30: 09:00:00 30 mL via SUBCUTANEOUS
  Filled 2021-08-30: qty 30

## 2021-08-30 MED ORDER — DIPHENHYDRAMINE HCL 25 MG PO CAPS: 25.0000 mg | ORAL_CAPSULE | Freq: Four times a day (QID) | ORAL | Status: DC | PRN

## 2021-08-30 MED ORDER — ONDANSETRON HCL 4 MG/2ML IJ SOLN: 4.0000 mg | INTRAMUSCULAR | Status: DC | PRN

## 2021-08-30 MED ORDER — BENZOCAINE-MENTHOL 20-0.5 % EX AERO: 1.0000 "application " | INHALATION_SPRAY | CUTANEOUS | Status: DC | PRN

## 2021-08-30 MED ORDER — OXYCODONE HCL 5 MG PO TABS: 5.0000 mg | ORAL_TABLET | ORAL | Status: DC | PRN

## 2021-08-30 MED ORDER — TERBUTALINE SULFATE 1 MG/ML IJ SOLN: 0.2500 mg | Freq: Once | INTRAMUSCULAR | Status: DC | PRN

## 2021-08-30 MED ORDER — SIMETHICONE 80 MG PO CHEW: 80.0000 mg | CHEWABLE_TABLET | ORAL | Status: DC | PRN

## 2021-08-30 MED ORDER — ACETAMINOPHEN 325 MG PO TABS: 650.0000 mg | ORAL_TABLET | ORAL | Status: DC | PRN

## 2021-08-30 MED ORDER — COCONUT OIL OIL: 1.0000 "application " | TOPICAL_OIL | Status: DC | PRN

## 2021-08-30 MED ORDER — OXYTOCIN-SODIUM CHLORIDE 30-0.9 UT/500ML-% IV SOLN
2.5000 [IU]/h | INTRAVENOUS | Status: DC
  Filled 2021-08-30: qty 500

## 2021-08-30 MED ORDER — LACTATED RINGERS IV SOLN: INTRAVENOUS | Status: DC

## 2021-08-30 MED ORDER — ONDANSETRON HCL 4 MG/2ML IJ SOLN
4.0000 mg | Freq: Four times a day (QID) | INTRAMUSCULAR | Status: DC | PRN
  Administered 2021-08-30: 08:00:00 4 mg via INTRAVENOUS
  Filled 2021-08-30: qty 2

## 2021-08-30 MED ORDER — SENNOSIDES-DOCUSATE SODIUM 8.6-50 MG PO TABS
2.0000 | ORAL_TABLET | Freq: Every day | ORAL | Status: DC
  Administered 2021-08-31: 11:00:00 2 via ORAL
  Filled 2021-08-30: qty 2

## 2021-08-30 MED ORDER — OXYCODONE HCL 5 MG PO TABS: 10.0000 mg | ORAL_TABLET | ORAL | Status: DC | PRN

## 2021-08-30 MED ORDER — ZOLPIDEM TARTRATE 5 MG PO TABS: 5.0000 mg | ORAL_TABLET | Freq: Every evening | ORAL | Status: DC | PRN

## 2021-08-30 MED ORDER — TETANUS-DIPHTH-ACELL PERTUSSIS 5-2.5-18.5 LF-MCG/0.5 IM SUSY: 0.5000 mL | PREFILLED_SYRINGE | Freq: Once | INTRAMUSCULAR | Status: DC

## 2021-08-30 MED ORDER — OXYTOCIN BOLUS FROM INFUSION
333.0000 mL | Freq: Once | INTRAVENOUS | Status: AC
  Administered 2021-08-30: 09:00:00 333 mL via INTRAVENOUS

## 2021-08-30 MED ORDER — LACTATED RINGERS IV SOLN: 500.0000 mL | INTRAVENOUS | Status: DC | PRN

## 2021-08-30 MED ORDER — DIBUCAINE (PERIANAL) 1 % EX OINT: 1.0000 "application " | TOPICAL_OINTMENT | CUTANEOUS | Status: DC | PRN

## 2021-08-30 MED ORDER — OXYCODONE-ACETAMINOPHEN 5-325 MG PO TABS: 2.0000 | ORAL_TABLET | ORAL | Status: DC | PRN

## 2021-08-30 MED ORDER — PRENATAL MULTIVITAMIN CH
1.0000 | ORAL_TABLET | Freq: Every day | ORAL | Status: DC
  Administered 2021-08-30 – 2021-08-31 (×2): 1 via ORAL
  Filled 2021-08-30 (×2): qty 1

## 2021-08-30 NOTE — Lactation Note (Signed)
This note was copied from a baby's chart. Lactation Consultation Note  Patient Name: Jennifer Price [id number] Date: 08/30/2021 Reason for consult: L&D Initial assessment Age:30 hours  P2, Baby cueing upon entering.  Baby latched off and on.  Noted sucking noises.  Baby sucking in bottom lip, repeatedly needed to be flanged and redirected onto breast.  Baby eager.  Lactation to follow up on MBU.   Maternal Data Does the patient have breastfeeding experience prior to this delivery?: Yes How long did the patient breastfeed?:  (a few days)  Feeding    LATCH Score Latch: Repeated attempts needed to sustain latch, nipple held in mouth throughout feeding, stimulation needed to elicit sucking reflex.  Audible Swallowing: A few with stimulation  Type of Nipple: Everted at rest and after stimulation  Comfort (Breast/Nipple): Soft / non-tender  Hold (Positioning): Assistance needed to correctly position infant at breast and maintain latch.  LATCH Score: 7    Interventions Interventions: Assisted with latch;Skin to skin;Education   Consult Status Consult Status: Follow-up from L&D    Jennifer Price [facility] 08/30/2021, 9:58 AM

## 2021-08-30 NOTE — Progress Notes (Signed)
Jennifer Price is a 31 y.o. G2P1001 at [redacted]w[redacted]d admitted for rupture of membranes  Subjective:  Pt resting without complaints. POC reviewed. Questions answered. Objective: BP 136/87    Pulse 70    Temp 98.9 F (37.2 C) (Axillary)    Resp 16    Ht 5\' 10"  (1.778 m)    Wt 98 kg    LMP 11/23/2020    SpO2 98%    BMI 30.99 kg/m  No intake/output data recorded. No intake/output data recorded.  FHT:  FHR: 135 bpm, variability: moderate,  accelerations:  Present,  decelerations:  Absent UC:   regular, every 2-4 minutes SVE:   Dilation: 3 Effacement (%): 60, 70 Station: -3 Exam by:: [email or web address], RN  Labs: Lab Results  Component Value Date   WBC 14.6 (H) 08/30/2021   HGB 10.0 (L) 08/30/2021   HCT 31.5 (L) 08/30/2021   MCV 87.3 08/30/2021   PLT 459 (H) 08/30/2021    Assessment / Plan: Spontaneous labor, progressing normally.   Labor:  Latent labor. Cervical change noted on exam. Pitocin 2x2 if needed. Fetal Wellbeing:  Category I Pain Control:  Labor support without medications I/D:   GBS negative Anticipated MOD:  NSVD Dr 09/01/2021 notified of pt status and POC  Sallye Ober Nayda Riesen MSN, CNM 08/30/2021, 6:26 AM

## 2021-08-30 NOTE — MAU Note (Signed)
Patient reports ROM at 2030. Contractions started shortly after. Patient states that her contractions are every 5 min and rating her pain at a 4/10. Patient endorses +FM and clear fluid.

## 2021-08-30 NOTE — Lactation Note (Signed)
This note was copied from a baby's chart. Lactation Consultation Note  Patient Name: Jennifer Price [id number] Date: 08/30/2021 Reason for consult: Initial assessment Age:30 hours  Mom ceased breastfeeding after leaving the hospital with first due to lack of BF support.  Pos. Breast changes during pregnancy Desires to BF exclusively BF Class taken during pregnancy  Mom has baby STS during visit.  LC assisted with latching in laid back position on left breast first.  Baby fed 10 minutes before falling asleep.  Nipple was rounded after feeding.  Mom latched with LC assistance to the right side.  Pillow support provided under arms. Baby latched but had non-nutritive sucking until mom used compressions and massage to stimulate.  A few swallows were heard.    LC provided BFSG info and encouraged mom to do STS often, hand express prior to latching, keep baby active during feeding.  8-12 feeds in 24 hours.  Infant still actively sucking and feeding when Campus left room.    Maternal Data Has patient been taught Hand Expression?: Yes Does the patient have breastfeeding experience prior to this delivery?: No (mom tried in the hospital but did not have adequate support when going home and switched to formula feeding)  Feeding Mother's Current Feeding Choice: Breast Milk  LATCH Score Latch: Repeated attempts needed to sustain latch, nipple held in mouth throughout feeding, stimulation needed to elicit sucking reflex.  Audible Swallowing: A few with stimulation  Type of Nipple: Everted at rest and after stimulation  Comfort (Breast/Nipple): Soft / non-tender  Hold (Positioning): Assistance needed to correctly position infant at breast and maintain latch.  LATCH Score: 7   Lactation Tools Discussed/Used    Interventions Interventions: Breast feeding basics reviewed;Assisted with latch;Skin to skin;Hand express;Adjust position;Support pillows  Discharge Pump: Personal (motif and  momcozy)  Consult Status Consult Status: Follow-up Date: 08/31/21 Follow-up type: In-patient    Ferne Coe [facility] 08/30/2021, 3:21 PM

## 2021-08-30 NOTE — H&P (Signed)
OB ADMISSION/ HISTORY & PHYSICAL:  Admission Date: 08/30/2021  1:21 AM  Admit Diagnosis: SROM  Jennifer Price is a 31 y.o. female G2P1001 [redacted]w[redacted]d presenting for SROM @ 2030. Endorses active FM, denies vaginal bleeding. Ctx began @ 2030  History of current pregnancy: G2P1001   Primary OB Provider: [facility] Patient entered care with [facility] at 13.2 wks.   EDC 08/30/21 by LMP04/19/22 and congruent w/ 13.2 wk U/S.   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    Prenatal Labs: ABO, Rh:  O positive Antibody:  Negative Rubella:   Immune RPR:   NR HBsAg:   NR HIV:   NR GTT: 100 GBS:   Negative GC/CHL: negative/negative Genetics: Declined Tdap/influenza vaccines: UTD/UTD   OB History  Gravida Para Term Preterm AB Living  2 1 1     1   SAB IAB Ectopic Multiple Live Births          1    # Outcome Date GA Lbr Len/2nd Weight Sex Delivery Anes PTL Lv  2 Current           1 Term 04/09/11 [redacted]w[redacted]d 17:07 / 00:22 3260 g M Vag-Spont Local  LIV     Birth Comments: none    Medical / Surgical History: Past medical history:  Past Medical History:  Diagnosis Date   Anxiety    Breast lump    No pertinent past medical history     Past surgical history:  Past Surgical History:  Procedure Laterality Date   HERNIA REPAIR     Family History:  Family History  Problem Relation Age of Onset   Hypertension Mother    Hypertension Father    Cancer Father    Diabetes Maternal Aunt    Cancer Maternal Grandfather     Social History:  reports that she has never smoked. She has never used smokeless tobacco. She reports that she does not drink alcohol and does not use drugs.  Allergies: Patient has no known allergies.   Current Medications at time of admission:  Prior to Admission medications    Medication Sig Start Date End Date Taking? Authorizing Provider  levonorgestrel-ethinyl estradiol (ALESSE) 0.1-20 MG-MCG tablet Take 1 tablet by mouth daily.    [provider]    Review of Systems: Constitutional: Negative   HENT: Negative   Eyes: Negative   Respiratory: Negative   Cardiovascular: Negative   Gastrointestinal: Negative  Genitourinary: negative for bloody show, positive for LOF   Musculoskeletal: Negative   Skin: Negative   Neurological: Negative   Endo/Heme/Allergies: Negative   Psychiatric/Behavioral: Negative    Physical Exam: VS: Blood pressure (!) 144/76, pulse 76, temperature 99 F (37.2 C), temperature source Oral, resp. rate 16, height 5\' 10"  (1.778 m), weight 98 kg, last menstrual period 11/23/2020, SpO2 98 %. AAO x3, no signs of distress Cardiovascular: RRR Respiratory: Lung fields clear to ausculation GU/GI: Abdomen gravid, non-tender, non-distended, active FM, vertex Extremities: trace edema, negative for pain, tenderness, and cords  Cervical exam:Dilation: 1 Effacement (%): 50 Exam by:: 11/25/2020, RN FHR: baseline rate 135 / variability moderate / accelerations present / absent decelerations TOCO: 2-5   Prenatal Transfer Tool  Maternal Diabetes: No Genetic Screening: Declined Maternal Ultrasounds/Referrals: Normal Fetal Ultrasounds or other Referrals:  None Maternal Substance Abuse:  No Significant Maternal  Medications:  None Significant Maternal Lab Results: Group B Strep negative    Assessment: 31 y.o. G2P1001 [redacted]w[redacted]d admitted for spontaneous PROM.   Latent stage of labor FHR category 1 GBS negative Pain management plan: all natural   Plan:  Admit to L&D Routine admission orders Epidural PRN Pitocin if needed for augmentation Dr Sallye Ober notified of admission and plan of care  Carollee Leitz MSN, CNM 08/30/2021 2:32 AM

## 2021-08-30 NOTE — Progress Notes (Signed)
Pt informed that the ultrasound is considered a limited OB ultrasound and is not intended to be a complete ultrasound exam.  Patient also informed that the ultrasound is not being completed with the intent of assessing for fetal or placental anomalies or any pelvic abnormalities.  Explained that the purpose of today's ultrasound is to assess for  presentation.  Patient acknowledges the purpose of the exam and the limitations of the study.    Cephalic  Torence Palmeri, NP   

## 2021-08-31 LAB — CBC
HCT: 29.2 % — ABNORMAL LOW (ref 36.0–46.0)
Hemoglobin: 9.5 g/dL — ABNORMAL LOW (ref 12.0–15.0)
MCH: 28.2 pg (ref 26.0–34.0)
MCHC: 32.5 g/dL (ref 30.0–36.0)
MCV: 86.6 fL (ref 80.0–100.0)
Platelets: 413 10*3/uL — ABNORMAL HIGH (ref 150–400)
RBC: 3.37 MIL/uL — ABNORMAL LOW (ref 3.87–5.11)
RDW: 15.4 % (ref 11.5–15.5)
WBC: 18.4 10*3/uL — ABNORMAL HIGH (ref 4.0–10.5)
nRBC: 0 % (ref 0.0–0.2)

## 2021-08-31 MED ORDER — IBUPROFEN 600 MG PO TABS: 600.0000 mg | ORAL_TABLET | Freq: Four times a day (QID) | ORAL | 3 refills | Status: DC | PRN

## 2021-08-31 MED ORDER — ACETAMINOPHEN 325 MG PO TABS
650.0000 mg | ORAL_TABLET | ORAL | 3 refills | Status: AC | PRN
Start: 2021-08-31 — End: ?

## 2021-08-31 NOTE — Social Work (Signed)
MOB was referred for history of depression and anxiety.  ° °* Referral screened out by Clinical Social Worker because none of the following criteria appear to apply: ° °~ History of anxiety/depression during this pregnancy, or of post-partum depression following prior delivery. No prenatal concerns noted. °~ Diagnosis of anxiety and/or depression within last 3 years. Per chart review, MOB diagnosed in 2017.  °OR °* MOB's symptoms currently being treated with medication and/or therapy. ° °Please contact the Clinical Social Worker if needs arise, by MOB request, or if MOB scores greater than 9/yes to question 10 on Edinburgh Postpartum Depression Screen. ° °Inna Tisdell, LCSWA °Clinical Social Work °[facility]  °[phone]  °

## 2021-08-31 NOTE — Discharge Summary (Signed)
[facility] [place] Discharge Summary   Patient Name:   Jennifer Price DOB:     11/06/1990 MRN:     [id number]  Date of Admission:   08/30/2021 Date of Discharge:  08/31/2021  Admitting diagnosis:    Normal labor and delivery [O80] Principal Problem:   Normal labor and delivery     Discharge diagnosis:   Normal labor and delivery [O80] Principal Problem:   Normal labor and delivery   Additional problems: none                                          Post partum procedures: none Augmentation: Pitocin Complications: None  Hospital course: Onset of Labor With Vaginal Delivery      31 y.o. yo [id number] at [redacted]w[redacted]d was admitted in Latent Labor on 08/30/2021. Patient had an uncomplicated labor course as follows:  Membrane Rupture Time/Date: 7:40 AM ,08/30/2021   Delivery Method:Vaginal, Spontaneous  Episiotomy: None  Lacerations:  Labial  Patient had an uncomplicated postpartum course.  She is ambulating, tolerating a regular diet, passing flatus, and urinating well. Patient is discharged home in stable condition on 08/31/21.  Newborn Data: Birth date:08/30/2021  Birth time:9:04 AM  Gender:Female  Living status:Living  Apgars:9 ,9  Weight:3820 g   Magnesium Sulfate received: No BMZ received: No Rhophylac:No MMR:No T-DaP:Given prenatally Flu: No Transfusion:No                                                               Type of Delivery:  NSVD Delivering Provider: Sanjuana Kava  Date of Delivery:  08/30/21  Newborn Data:  Baby Feeding:   Breast Disposition:   home with mother  Physical Exam:   Vitals:   08/30/21 1600 08/30/21 2008 08/30/21 2334 08/31/21 0511  BP: 124/69 125/81 128/65 124/73  Pulse: 67 71 83 69  Resp: $Remo'18 16 16 16  'XtDTQ$ Temp: 98.3 F (36.8 C) 98 F (36.7 C) 98.1 F (36.7 C) 97.9 F (36.6 C)  TempSrc: Oral Oral Oral Oral  SpO2:  100% 100% 98%  Weight:      Height:       General: alert, cooperative, and no distress Lochia: appropriate Uterine  Fundus: firm Incision: N/A DVT Evaluation: No evidence of DVT seen on physical exam. Negative Homan's sign. No cords or calf tenderness.  Labs: Lab Results  Component Value Date   WBC 18.4 (H) 08/31/2021   HGB 9.5 (L) 08/31/2021   HCT 29.2 (L) 08/31/2021   MCV 86.6 08/31/2021   PLT 413 (H) 08/31/2021   No flowsheet data found.  Discharge instruction: per After Visit Summary and "Baby and Me Booklet".  After Visit Meds:  Allergies as of 08/31/2021   No Known Allergies      Medication List     TAKE these medications    acetaminophen 325 MG tablet Commonly known as: Tylenol Take 2 tablets (650 mg total) by mouth every 4 (four) hours as needed (for pain scale < 4).   calcium carbonate 750 MG chewable tablet Commonly known as: TUMS EX Chew 1-2 tablets by mouth daily as needed for heartburn.   ibuprofen 600 MG tablet Commonly known as: ADVIL  Take 1 tablet (600 mg total) by mouth every 6 (six) hours as needed for moderate pain or cramping.   prenatal multivitamin Tabs tablet Take 1 tablet by mouth daily at 12 noon.        Diet: routine diet  Activity: Advance as tolerated. Pelvic rest for 6 weeks.   Outpatient follow up:6 weeks Follow up Appt:No future appointments. Follow up visit: No follow-ups on file.  Postpartum contraception: Not Discussed  08/31/2021 Sanjuana Kava, MD

## 2021-08-31 NOTE — Lactation Note (Signed)
This note was copied from a baby's chart. Lactation Consultation Note  Patient Name: Jennifer Price [id number] Date: 08/31/2021 Reason for consult: Follow-up assessment;1st time breastfeeding;Term Age:30 hours  LC in to visit with P2 Mom of term baby on day of discharge.  Baby at 3% weight loss. Baby was circumcised this am and is sleepy.  Attempted to latch baby, but he wouldn't open his mouth.  Recommended holding baby STS until he starts feeding again.  Encouraged STS with baby and offering the breast often with feeding cues.  Baby breastfed 8 times in the first 78 hrs old life and has good output. Reviewed basics with Mom about the importance of a deep latch to the breast.  Nipples are without any visible trauma, some soreness felt.  Mom to hand express colostrum onto nipples for soreness.  Mom states she feels good about going home. Engorgement prevention and treatment reviewed.  Mom aware of OP lactation support available.  Encouraged to call prn for concerns.  Interventions Interventions: Breast feeding basics reviewed;Skin to skin;Breast massage;Hand express  Discharge Discharge Education: Engorgement and breast care;Warning signs for feeding baby Pump: Personal (Clarisa Schools, Momcozy, [employer] Motif)  Consult Status Consult Status: Complete Date: 08/31/21 Follow-up type: Call as needed    [name] 08/31/2021, 12:55 PM

## 2021-09-13 ENCOUNTER — Telehealth (HOSPITAL_COMMUNITY): Payer: Self-pay | Admitting: *Deleted

## 2021-09-13 NOTE — Telephone Encounter (Signed)
Attempted Hospital Discharge Follow-Up Call.  Left voice mail requesting that patient return RN's phone call.  

## 2022-03-17 DIAGNOSIS — F430 Acute stress reaction: Secondary | ICD-10-CM | POA: Diagnosis not present

## 2022-06-19 ENCOUNTER — Emergency Department (HOSPITAL_BASED_OUTPATIENT_CLINIC_OR_DEPARTMENT_OTHER): Payer: Medicaid Other

## 2022-06-19 ENCOUNTER — Other Ambulatory Visit: Payer: Self-pay

## 2022-06-19 ENCOUNTER — Emergency Department (HOSPITAL_BASED_OUTPATIENT_CLINIC_OR_DEPARTMENT_OTHER): Payer: Medicaid Other | Admitting: Radiology

## 2022-06-19 ENCOUNTER — Emergency Department (HOSPITAL_BASED_OUTPATIENT_CLINIC_OR_DEPARTMENT_OTHER)
Admission: EM | Admit: 2022-06-19 | Discharge: 2022-06-19 | Disposition: A | Discharge status: Home / Self Care | Payer: Medicaid Other | Attending: Emergency Medicine | Admitting: Emergency Medicine

## 2022-06-19 ENCOUNTER — Encounter (HOSPITAL_BASED_OUTPATIENT_CLINIC_OR_DEPARTMENT_OTHER): Payer: Self-pay | Admitting: Emergency Medicine

## 2022-06-19 DIAGNOSIS — Y92410 Unspecified street and highway as the place of occurrence of the external cause: Secondary | ICD-10-CM | POA: Insufficient documentation

## 2022-06-19 DIAGNOSIS — R519 Headache, unspecified: Secondary | ICD-10-CM | POA: Diagnosis not present

## 2022-06-19 DIAGNOSIS — M545 Low back pain, unspecified: Secondary | ICD-10-CM | POA: Diagnosis not present

## 2022-06-19 DIAGNOSIS — M542 Cervicalgia: Secondary | ICD-10-CM | POA: Insufficient documentation

## 2022-06-19 LAB — PREGNANCY, URINE: Preg Test, Ur: NEGATIVE

## 2022-06-19 MED ORDER — IBUPROFEN 400 MG PO TABS
600.0000 mg | ORAL_TABLET | Freq: Once | ORAL | Status: AC
  Administered 2022-06-19: 600 mg via ORAL
  Filled 2022-06-19: qty 1
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
## 2022-06-19 NOTE — Discharge Instructions (Signed)
Note the work-up today was overall reassuring.  Imaging studies were negative for any acute abnormalities.  Recommend taking Motrin/Tylenol as needed for pain/headache at home.  Recommend reevaluation by your primary care provider in 3 to 5 days for reevaluation.  Please do not hesitate to return to emergency department for worrisome signs symptoms we discussed apparent.

## 2022-07-05 ENCOUNTER — Encounter: Payer: Self-pay | Admitting: Nurse Practitioner

## 2022-07-05 ENCOUNTER — Ambulatory Visit (INDEPENDENT_AMBULATORY_CARE_PROVIDER_SITE_OTHER): Payer: Medicaid Other | Admitting: Nurse Practitioner

## 2022-07-05 DIAGNOSIS — M542 Cervicalgia: Secondary | ICD-10-CM

## 2022-07-05 DIAGNOSIS — M545 Low back pain, unspecified: Secondary | ICD-10-CM | POA: Diagnosis not present

## 2022-07-05 DIAGNOSIS — R519 Headache, unspecified: Secondary | ICD-10-CM | POA: Insufficient documentation

## 2022-07-05 DIAGNOSIS — G4489 Other headache syndrome: Secondary | ICD-10-CM | POA: Diagnosis not present

## 2022-07-05 DIAGNOSIS — Z09 Encounter for follow-up examination after completed treatment for conditions other than malignant neoplasm: Secondary | ICD-10-CM

## 2022-07-05 MED ORDER — CYCLOBENZAPRINE HCL 5 MG PO TABS
5.0000 mg | ORAL_TABLET | Freq: Three times a day (TID) | ORAL | 0 refills | Status: AC | PRN
Start: 2022-07-05 — End: ?

## 2022-07-05 MED ORDER — IBUPROFEN 600 MG PO TABS: 600.0000 mg | ORAL_TABLET | Freq: Four times a day (QID) | ORAL | 0 refills | Status: AC | PRN
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
## 2022-07-05 NOTE — Assessment & Plan Note (Signed)
Low back pain rated 7/10 today Take Flexeril 5 mg every 8 hours as needed, ibuprofen 600 mg every 8 hours as needed.  Application of heat  and ice encouraged Stretching exercises encouraged She is currently seeing a chiropractor

## 2022-07-05 NOTE — Progress Notes (Signed)
See note above

## 2022-07-05 NOTE — Assessment & Plan Note (Signed)
She currently denies neck pain Take Flexeril 5 mg every 8 hours as needed, ibuprofen 600 mg every 8 hours as needed.  Application of heat  and ice encouraged She is currently seeing a chiropractor
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
